# Patient Record
Sex: Female | Born: 1940 | Race: White | Hispanic: No | Marital: Married | Smoking: Never smoker
Health system: Southern US, Community
[De-identification: ages and names within clinical notes are randomized; demographics above are authoritative.]

## PROBLEM LIST (undated history)

## (undated) DIAGNOSIS — E039 Hypothyroidism, unspecified: Secondary | ICD-10-CM

## (undated) DIAGNOSIS — G20 Parkinson's disease: Secondary | ICD-10-CM

## (undated) DIAGNOSIS — G20A1 Parkinson's disease without dyskinesia, without mention of fluctuations: Secondary | ICD-10-CM

## (undated) DIAGNOSIS — I10 Essential (primary) hypertension: Secondary | ICD-10-CM

## (undated) DIAGNOSIS — E785 Hyperlipidemia, unspecified: Secondary | ICD-10-CM

## (undated) HISTORY — DX: Parkinson's disease: G20

## (undated) HISTORY — DX: Parkinson's disease without dyskinesia, without mention of fluctuations: G20.A1

## (undated) HISTORY — PX: ABDOMINAL HYSTERECTOMY: SHX81

## (undated) HISTORY — PX: APPENDECTOMY: SHX54

## (undated) HISTORY — DX: Essential (primary) hypertension: I10

## (undated) HISTORY — DX: Hypothyroidism, unspecified: E03.9

## (undated) HISTORY — DX: Hyperlipidemia, unspecified: E78.5

---

## 2013-04-19 DIAGNOSIS — M1990 Unspecified osteoarthritis, unspecified site: Secondary | ICD-10-CM | POA: Insufficient documentation

## 2013-04-19 DIAGNOSIS — R609 Edema, unspecified: Secondary | ICD-10-CM | POA: Insufficient documentation

## 2013-04-19 DIAGNOSIS — J309 Allergic rhinitis, unspecified: Secondary | ICD-10-CM | POA: Insufficient documentation

## 2013-04-19 DIAGNOSIS — G4700 Insomnia, unspecified: Secondary | ICD-10-CM | POA: Insufficient documentation

## 2013-05-12 DIAGNOSIS — R6884 Jaw pain: Secondary | ICD-10-CM | POA: Insufficient documentation

## 2013-06-30 DIAGNOSIS — I10 Essential (primary) hypertension: Secondary | ICD-10-CM | POA: Insufficient documentation

## 2013-10-18 DIAGNOSIS — M1990 Unspecified osteoarthritis, unspecified site: Secondary | ICD-10-CM | POA: Insufficient documentation

## 2013-12-29 DIAGNOSIS — M79606 Pain in leg, unspecified: Secondary | ICD-10-CM | POA: Insufficient documentation

## 2014-07-20 DIAGNOSIS — B372 Candidiasis of skin and nail: Secondary | ICD-10-CM | POA: Insufficient documentation

## 2014-07-20 DIAGNOSIS — R2681 Unsteadiness on feet: Secondary | ICD-10-CM | POA: Insufficient documentation

## 2014-12-18 DIAGNOSIS — R32 Unspecified urinary incontinence: Secondary | ICD-10-CM | POA: Insufficient documentation

## 2014-12-18 DIAGNOSIS — R531 Weakness: Secondary | ICD-10-CM | POA: Insufficient documentation

## 2015-05-31 ENCOUNTER — Encounter: Payer: Self-pay | Admitting: Neurology

## 2015-06-05 ENCOUNTER — Encounter: Payer: Self-pay | Admitting: Neurology

## 2015-06-05 ENCOUNTER — Ambulatory Visit (INDEPENDENT_AMBULATORY_CARE_PROVIDER_SITE_OTHER): Payer: Medicare Other | Admitting: Neurology

## 2015-06-05 VITALS — BP 150/88 | HR 88 | Ht 60.0 in

## 2015-06-05 DIAGNOSIS — R2681 Unsteadiness on feet: Secondary | ICD-10-CM

## 2015-06-05 DIAGNOSIS — R279 Unspecified lack of coordination: Secondary | ICD-10-CM | POA: Diagnosis not present

## 2015-06-05 DIAGNOSIS — R292 Abnormal reflex: Secondary | ICD-10-CM

## 2015-06-05 NOTE — Progress Notes (Signed)
Shelley Barnes was seen today in the movement disorders clinic for neurologic consultation.  Her PCP is [name], Nolon Bussing, MD.   The patient is seen today as a second opinion from Dr. Anne Hahn.  I have reviewed Dr. Clarisa Kindred records.  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  The patient was first seen by Dr. Anne Hahn in October, 2016 with a complaint of left-sided weakness.  Pt states that sx's started about one year ago.  She states that she noted difficulty getting in and out of a chair and difficulty bending over.  She agrees that she has L sided "weakness" and had trouble getting in and out of exam shorts today because she had trouble lifting the L leg.    The patient was started on levodopa by Dr. Anne Hahn, but was not able to tolerate more than carbidopa/levodopa 25/100, 1 tablet 4 times per day.   Pt states that her husband told her that the medication helped but the patient was not convinced.   Dosages higher than this caused nausea (tried 2 tablets at a time).  She admits that she has dropped the medication to one at night only for "jittery legs."   She refused DaT scanning per the records.    The patient had an MRI of the brain with and without gadolinium on 12/18/2014.  There was evidence of moderate atrophy and white matter disease.  I don't have films for my review.     Specific Symptoms:  Tremor: No. Voice: more raspy and weaker; trouble singing Sleep: sleeps well  Vivid Dreams:  No.  Acting out dreams:  No. Wet Pillows: No. Postural symptoms:  Yes.    Falls?  Yes.   (3 falls - one time getting out of bed; one time tying shoes and had one leg up to tie shoe and fell backward; one time someone at door and she got up too quickly and fell) Bradykinesia symptoms: difficulty getting out of a chair and drags the L leg and describes an antalgic gait Loss of smell:  No. Loss of taste:  Yes.   Urinary Incontinence:  Yes.   (has urinary urgency/frequency  but doesn't wear undergarments) Difficulty Swallowing:  No. Handwriting, micrographia: Yes.   (R handed) Trouble with ADL's:  Yes.   (trouble getting into pants; no longer able to wear panty hose)  Trouble buttoning clothing: No. Depression:  No. Memory changes:  No. Hallucinations:  No.  visual distortions: No. N/V:  No. Lightheaded:  No.  Syncope: No. Diplopia:  No. Dyskinesia:  No.    ALLERGIES:   Allergies  Allergen Reactions  . Azithromycin   . Clindamycin/Lincomycin     CURRENT MEDICATIONS:  Outpatient Encounter Prescriptions as of 06/05/2015  Medication Sig  . aspirin 81 MG tablet Take 81 mg by mouth daily.  . cetirizine (ZYRTEC) 10 MG tablet Take 10 mg by mouth daily.  . cholecalciferol (VITAMIN D) 1000 units tablet Take 5,000 Units by mouth daily.  . Cyanocobalamin (VITAMIN B-12) 2500 MCG SUBL Place under the tongue.  . furosemide (LASIX) 40 MG tablet Take 40 mg by mouth.  . levothyroxine (SYNTHROID, LEVOTHROID) 75 MCG tablet Take 75 mcg by mouth daily before breakfast.  . omeprazole (PRILOSEC) 20 MG capsule Take 20 mg by mouth daily.  . simvastatin (ZOCOR) 10 MG tablet Take 10 mg by mouth daily.  . [DISCONTINUED] carbidopa-levodopa (SINEMET IR) 25-100 MG tablet Take 2 tablets by mouth 3 (three) times daily.  . [  DISCONTINUED] rasagiline (AZILECT) 0.5 MG TABS tablet Take 0.5 mg by mouth daily.   No facility-administered encounter medications on file as of 06/05/2015.    PAST MEDICAL HISTORY:   Past Medical History  Diagnosis Date  . Hypothyroidism   . Hyperlipidemia   . Hypertension   . Parkinson's disease (HCC)     PAST SURGICAL HISTORY:   Past Surgical History  Procedure Laterality Date  . Appendectomy    . Abdominal hysterectomy      SOCIAL HISTORY:   Social History   Social History  . Marital Status: Married    Spouse Name: N/A  . Number of Children: N/A  . Years of Education: N/A   Occupational History  . retired     Engineer, site; Licensed conveyancer; Print production planner   Social History Main Topics  . Smoking status: Never Smoker   . Smokeless tobacco: Not on file  . Alcohol Use: No  . Drug Use: No  . Sexual Activity: Not on file   Other Topics Concern  . Not on file   Social History Narrative    FAMILY HISTORY:   Family Status  Relation Status Death Age  . Mother Deceased     HTN; unknown cause of death  . Father Deceased     Angina, DM  . Sister Alive     2, both with DM  . Brother Alive     DM  . Child Alive     2, adopted, healthy    ROS:  A complete 10 system review of systems was obtained and was unremarkable apart from what is mentioned above.  PHYSICAL EXAMINATION:    VITALS:   Filed Vitals:   06/05/15 1015  BP: 150/88  Pulse: 88  Height: 5' (1.524 m)   Pt refused to weigh  Pt placed into examining shorts and a gown for the examination.  GEN:  The patient appears stated age and is in NAD. HEENT:  Normocephalic, atraumatic.  The mucous membranes are moist. The superficial temporal arteries are without ropiness or tenderness.  No tongue fasciculations CV:  RRR Lungs:  CTAB Neck/HEME:  There are no carotid bruits bilaterally.  Neurological examination:  Orientation: The patient is alert and oriented x3. Fund of knowledge is appropriate.  Recent and remote memory are intact.  Attention and concentration are normal.    Able to name objects and repeat phrases. Cranial nerves: There is good facial symmetry. No facial hypomimia.  Pupils are equal round and reactive to light bilaterally. Fundoscopic exam reveals clear margins bilaterally. Extraocular muscles are intact with the exception of mild upgaze paresis.  There were a few square wave jerks.   The visual fields are full to confrontational testing. The speech is fluent and clear. Minimal trouble with gutteral sounds.  Soft palate rises symmetrically and there is no tongue deviation. Hearing is intact to conversational tone. Sensation: Sensation is  intact to light and pinprick throughout (facial, trunk, extremities). Vibration is absent at the bilateral big toe, ankle and decreased at the knee. There is no extinction with double simultaneous stimulation. There is no sensory dermatomal level identified. Motor: Strength is 5/5 in the bilateral upper and lower extremities.   Shoulder shrug is equal and symmetric.  There is no pronator drift.  No fasciculations noted across the chest, back, arms or legs. Deep tendon reflexes: Deep tendon reflexes are 2+-3/4 at the bilateral biceps, triceps, brachioradialis, patella and achilles. Plantar response is upgoing on the L and downgoing  on the R  Movement examination: Tone: There is normal tone in the bilateral upper extremities.  The tone in the lower extremities is normal.  Tone remains normal even with distraction procedures. Abnormal movements: none, even with distraction Coordination:  Pt does have slowness with finger taps and alternation of supination/pronation of forearm on the L but no decremation. Gait and Station: The patient has minimal difficulty arising out of a deep-seated chair without the use of the hands.  She ambulates with a wide-based gait.  She has difficulty ambulating in a tandem fashion.  She is able to stand in the Romberg position.  ASSESSMENT/PLAN:  1.  Gait instability with L sided incoordination  -Long talk with the patient today.  I told her that she does not have idiopathic Parkinson's disease.  While she may have MSA, it is not at the top of my list of differential diagnoses (and she is a little old for this dx).  She really has very normal tone and her walking is not parkinsonian.  She does have an upgoing toe on the left.  I talked to her about doing an MRI of the cervical spine.  We also talked about potentially looking at EMG.  If not early done so, I would recommend anti-GAD antibodies for completeness sake.  She is going to talk this over with Dr. Anne Hahn.  She has not  wanted to pursue DaT.  I did tell her that if her w/u is negative and the diagnosis continues to be elusive, I would be happy to see her back and re-evaluate her.  She understood and said that she would make an appt today to discuss the above with Dr. Anne Hahn.

## 2015-07-23 DIAGNOSIS — M5030 Other cervical disc degeneration, unspecified cervical region: Secondary | ICD-10-CM | POA: Insufficient documentation

## 2015-07-23 DIAGNOSIS — M4802 Spinal stenosis, cervical region: Secondary | ICD-10-CM | POA: Insufficient documentation

## 2015-07-25 ENCOUNTER — Ambulatory Visit: Payer: Medicare Other | Admitting: Neurology

## 2015-07-26 ENCOUNTER — Ambulatory Visit (INDEPENDENT_AMBULATORY_CARE_PROVIDER_SITE_OTHER): Payer: Medicare Other | Admitting: Neurology

## 2015-07-26 ENCOUNTER — Telehealth: Payer: Self-pay | Admitting: Neurology

## 2015-07-26 ENCOUNTER — Encounter: Payer: Self-pay | Admitting: Neurology

## 2015-07-26 VITALS — BP 148/88 | HR 85 | Ht 60.0 in

## 2015-07-26 DIAGNOSIS — R251 Tremor, unspecified: Secondary | ICD-10-CM

## 2015-07-26 DIAGNOSIS — G20 Parkinson's disease: Secondary | ICD-10-CM

## 2015-07-26 NOTE — Patient Instructions (Signed)
1. We are referring you to [facility] for a DaT scan. They will contact you directly to set up a time for this testing. If you do not hear from them they can be contacted at [phone].  

## 2015-07-26 NOTE — Progress Notes (Signed)
Shelley Barnes was seen today in the movement disorders clinic for neurologic consultation.  Her PCP is Shelley Barnes, Shelley Bussing, MD.   The patient is seen today as a second opinion from Dr. Anne Hahn.  I have reviewed Dr. Clarisa Kindred records.  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  The patient was first seen by Dr. Anne Hahn in October, 2016 with a complaint of left-sided weakness.  Pt states that sx's started about one year ago.  She states that she noted difficulty getting in and out of a chair and difficulty bending over.  She agrees that she has L sided "weakness" and had trouble getting in and out of exam shorts today because she had trouble lifting the L leg.    The patient was started on levodopa by Dr. Anne Hahn, but was not able to tolerate more than carbidopa/levodopa 25/100, 1 tablet 4 times per day.   Pt states that her husband told her that the medication helped but the patient was not convinced.   Dosages higher than this caused nausea (tried 2 tablets at a time).  She admits that she has dropped the medication to one at night only for "jittery legs."   She refused DaT scanning per the records.    The patient had an MRI of the brain with and without gadolinium on 12/18/2014.  There was evidence of moderate atrophy and white matter disease.  I don't have films for my review.     07/26/15 update:  The patient follows up today.  This patient is accompanied in the office by her spouse who supplements the history.  She went back to Dr. Anne Hahn since our last visit and had an EMG.  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  This was reported to show mildly reduced activation in all leg muscles, consistent with either poor effort or upper motor neuron lesion.  She has previously had an MRI of the brain in August, 2016 that apparently showed atrophy and moderate white matter disease.  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  There was a disc protrusion at the C6-C7 level but no cord deformity.  I do not have those films, but only the report.  She subsequently saw neurosurgery on 07/23/2015.  I reviewed those notes, although I am not sure if they are complete.  There was no plan listed, only a diagnosis.  They tell me today that the neurosurgeon told them that they were told that there was no myelopathy as the cause of the sx's.  They express that neurosurgeon was concerned that the "tongue was vibrating."  Husband and patient states that she is the same since last visit.  No falls.  No swallowing.      ALLERGIES:   Allergies  Allergen Reactions  . Azithromycin   . Clindamycin/Lincomycin     CURRENT MEDICATIONS:  Outpatient Encounter Prescriptions as of 07/26/2015  Medication Sig  . aspirin 81 MG tablet Take 81 mg by mouth daily.  . cetirizine (ZYRTEC) 10 MG tablet Take 10 mg by mouth daily.  . cholecalciferol (VITAMIN D) 1000 units tablet Take 5,000 Units by mouth daily.  . Cyanocobalamin (VITAMIN B-12) 2500 MCG SUBL Place under the tongue.  . furosemide (LASIX) 40 MG tablet Take  40 mg by mouth.  . levothyroxine (SYNTHROID, LEVOTHROID) 75 MCG tablet Take 75 mcg by mouth daily before breakfast.  . omeprazole (PRILOSEC) 20 MG capsule Take 20 mg by mouth daily.  . simvastatin (ZOCOR) 10 MG tablet Take 10 mg by mouth daily.   No facility-administered encounter medications on file as of 07/26/2015.    PAST MEDICAL HISTORY:   Past Medical History  Diagnosis Date  . Hypothyroidism   . Hyperlipidemia   . Hypertension   . Parkinson's disease (HCC)     PAST SURGICAL HISTORY:   Past Surgical History  Procedure Laterality Date  . Appendectomy    . Abdominal hysterectomy      SOCIAL HISTORY:   Social History   Social History    . Marital Status: Married    Spouse Name: N/A  . Number of Children: N/A  . Years of Education: N/A   Occupational History  . retired     Engineer, site; Research officer, political party; Print production planner   Social History Main Topics  . Smoking status: Never Smoker   . Smokeless tobacco: Not on file  . Alcohol Use: No  . Drug Use: No  . Sexual Activity: Not on file   Other Topics Concern  . Not on file   Social History Narrative    FAMILY HISTORY:   Family Status  Relation Status Death Age  . Mother Deceased     HTN; unknown cause of death  . Father Deceased     Angina, DM  . Sister Alive     2, both with DM  . Brother Alive     DM  . Child Alive     2, adopted, healthy    ROS:  A complete 10 system review of systems was obtained and was unremarkable apart from what is mentioned above.  PHYSICAL EXAMINATION:    VITALS:   Filed Vitals:   07/26/15 1013  BP: 148/88  Pulse: 85  Height: 5' (1.524 m)   Pt refused to weigh  Pt placed into examining shorts and a gown for the examination.  GEN:  The patient appears stated age and is in NAD. HEENT:  Normocephalic, atraumatic.  The mucous membranes are moist. The superficial temporal arteries are without ropiness or tenderness.  No tongue fasciculations CV:  RRR Lungs:  CTAB Neck/HEME:  There are no carotid bruits bilaterally.  Neurological examination:  Orientation: The patient is alert and oriented x3.  Cranial nerves: There is good facial symmetry. No facial hypomimia.  Pupils are equal round and reactive to light bilaterally. Fundoscopic exam reveals clear margins bilaterally. Extraocular muscles are intact with the exception of mild upgaze paresis.  She is also somewhat apraxic when asked to perform EOM testing.  There were a few square wave jerks.   The visual fields are full to confrontational testing. The speech is fluent and clear. Minimal trouble with gutteral sounds.  Soft palate rises symmetrically and there is no tongue  deviation. Hearing is intact to conversational tone. Sensation: Sensation is intact to light and pinprick throughout (facial, trunk, extremities). Vibration is absent at the bilateral big toe, ankle and decreased at the knee. There is no extinction with double simultaneous stimulation. There is no sensory dermatomal level identified. Motor: Strength is 5/5 in the bilateral upper and lower extremities.   Shoulder shrug is equal and symmetric.  There is no pronator drift.  No fasciculations noted across the chest, back, arms or legs. Deep tendon reflexes: Deep tendon reflexes are  2+/4 at the bilateral biceps, triceps, brachioradialis, patella and achilles. Plantar response is upgoing bilaterally today  Movement examination: Tone: There is normal tone in the bilateral upper extremities.  The tone in the lower extremities is normal.  Tone remains normal even with distraction procedures. Abnormal movements: slight action tremor when asked to mimic motion of screwing in light bulb.  Otherwise none. Coordination:  Pt does have slowness of all forms of RAMs on the L.   Gait and Station: The patient has minimal difficulty arising out of a deep-seated chair without the use of the hands.  She ambulates with a wide-based gait.  She has difficulty ambulating in a tandem fashion.  She is able to stand in the Romberg position.  ASSESSMENT/PLAN:  1.  Gait instability with L sided incoordination  -Long talk with the patient and her husband today.  I once again told him that she does not have idiopathic Parkinson's disease.  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  I'm going to send her for a DaT scan.  She has already tried levodopa in the past, although at fairly low dosages and we may need to push the dosage in the future.  If this workup is negative, then I may have her see Dr. Allena KatzPatel.  She and her husband were in agreement.  Follow-up after her DaT  scan.  Greater than 50% of the 30 minute visit was spent in counseling with the patient and her husband.  He had not come to previous visit and had several questions.

## 2015-07-26 NOTE — Telephone Encounter (Signed)
[facility]. They will call patient to schedule.

## 2015-07-27 ENCOUNTER — Encounter: Payer: Self-pay | Admitting: Neurology

## 2015-08-09 ENCOUNTER — Encounter: Payer: Self-pay | Admitting: Neurology

## 2015-08-22 ENCOUNTER — Encounter: Payer: Self-pay | Admitting: Neurology

## 2015-08-22 ENCOUNTER — Ambulatory Visit (INDEPENDENT_AMBULATORY_CARE_PROVIDER_SITE_OTHER): Payer: Medicare Other | Admitting: Neurology

## 2015-08-22 VITALS — BP 126/78 | HR 84 | Resp 16

## 2015-08-22 DIAGNOSIS — R1314 Dysphagia, pharyngoesophageal phase: Secondary | ICD-10-CM | POA: Diagnosis not present

## 2015-08-22 DIAGNOSIS — G231 Progressive supranuclear ophthalmoplegia [Steele-Richardson-Olszewski]: Secondary | ICD-10-CM | POA: Diagnosis not present

## 2015-08-22 DIAGNOSIS — G20 Parkinson's disease: Secondary | ICD-10-CM | POA: Diagnosis not present

## 2015-08-22 MED ORDER — CARBIDOPA-LEVODOPA 25-100 MG PO TABS: 1.0000 | ORAL_TABLET | Freq: Three times a day (TID) | ORAL | Status: DC

## 2015-08-22 NOTE — Progress Notes (Signed)
Shelley Barnes was seen today in the movement disorders clinic for neurologic consultation.  Her PCP is [name], Nolon Bussing, MD.   The patient is seen today as a second opinion from Dr. Anne Hahn.  I have reviewed Dr. Clarisa Kindred records.  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  The patient was first seen by Dr. Anne Hahn in October, 2016 with a complaint of left-sided weakness.  Pt states that sx's started about one year ago.  She states that she noted difficulty getting in and out of a chair and difficulty bending over.  She agrees that she has L sided "weakness" and had trouble getting in and out of exam shorts today because she had trouble lifting the L leg.    The patient was started on levodopa by Dr. Anne Hahn, but was not able to tolerate more than carbidopa/levodopa 25/100, 1 tablet 4 times per day.   Pt states that her husband told her that the medication helped but the patient was not convinced.   Dosages higher than this caused nausea (tried 2 tablets at a time).  She admits that she has dropped the medication to one at night only for "jittery legs."   She refused DaT scanning per the records.    The patient had an MRI of the brain with and without gadolinium on 12/18/2014.  There was evidence of moderate atrophy and white matter disease.  I don't have films for my review.     07/26/15 update:  The patient follows up today.  This patient is accompanied in the office by her spouse who supplements the history.  She went back to Dr. Anne Hahn since our last visit and had an EMG.  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  This was reported to show mildly reduced activation in all leg muscles, consistent with either poor effort or upper motor neuron lesion.  She has previously had an MRI of the brain in August, 2016 that apparently showed atrophy and moderate white matter disease.  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  There was a disc protrusion at the C6-C7 level but no cord deformity.  I do not have those films, but only the report.  She subsequently saw neurosurgery on 07/23/2015.  I reviewed those notes, although I am not sure if they are complete.  There was no plan listed, only a diagnosis.  They tell me today that the neurosurgeon told them that they were told that there was no myelopathy as the cause of the sx's.  They express that neurosurgeon was concerned that the "tongue was vibrating."  Husband and patient states that she is the same since last visit.  No falls.  No swallowing.    08/22/15 update:  The patient follows up today, accompanied by her husband who supplements the history.  Since our last visit, she did have a DaT scan.  The study was limited by motion, but it was felt to be interpretable.  There was asymmetric uptake, grade 1, with normal uptake on the left and decreased uptake in the right putamen.  This does correlate with her symptoms of decreased coordination on the left.    ALLERGIES:   Allergies  Allergen Reactions  . Azithromycin   . Clindamycin/Lincomycin  CURRENT MEDICATIONS:  Outpatient Encounter Prescriptions as of 08/22/2015  Medication Sig  . aspirin 81 MG tablet Take 81 mg by mouth daily.  . cetirizine (ZYRTEC) 10 MG tablet Take 10 mg by mouth daily.  . cholecalciferol (VITAMIN D) 1000 units tablet Take 5,000 Units by mouth daily.  . Cyanocobalamin (VITAMIN B-12) 2500 MCG SUBL Place under the tongue.  . furosemide (LASIX) 40 MG tablet Take 40 mg by mouth.  . levothyroxine (SYNTHROID, LEVOTHROID) 75 MCG tablet Take 75 mcg by mouth daily before breakfast.  . omeprazole (PRILOSEC) 20 MG capsule Take 20 mg by mouth daily.  . simvastatin (ZOCOR) 10 MG tablet Take 10 mg by mouth daily.  .  carbidopa-levodopa (SINEMET IR) 25-100 MG tablet Take 1 tablet by mouth 3 (three) times daily.   No facility-administered encounter medications on file as of 08/22/2015.    PAST MEDICAL HISTORY:   Past Medical History  Diagnosis Date  . Hypothyroidism   . Hyperlipidemia   . Hypertension   . Parkinson's disease (HCC)     PAST SURGICAL HISTORY:   Past Surgical History  Procedure Laterality Date  . Appendectomy    . Abdominal hysterectomy      SOCIAL HISTORY:   Social History   Social History  . Marital Status: Married    Spouse Name: N/A  . Number of Children: N/A  . Years of Education: N/A   Occupational History  . 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 Smoking status: Never Smoker   . Smokeless tobacco: Not on file  . Alcohol Use: No  . Drug Use: No  . Sexual Activity: Not on file   Other Topics Concern  . Not on file   Social History Narrative    FAMILY HISTORY:   Family Status  Relation Status Death Age  . Mother Deceased     HTN; unknown cause of death  . Father Deceased     Angina, DM  . Sister Alive     2, both with DM  . Brother Alive     DM  . Child Alive     2, adopted, healthy    ROS:  A complete 10 system review of systems was obtained and was unremarkable apart from what is mentioned above.  PHYSICAL EXAMINATION:    VITALS:   Filed Vitals:   08/22/15 1024  BP: 126/78  Pulse: 84  Resp: 16  SpO2: 96%   Pt refused to weigh  Pt placed into examining shorts and a gown for the examination.  GEN:  The patient appears stated age and is in NAD. HEENT:  Normocephalic, atraumatic.  The mucous membranes are moist. The superficial temporal arteries are without ropiness or tenderness.  No tongue fasciculations CV:  RRR Lungs:  CTAB Neck/HEME:  There are no carotid bruits bilaterally.  Neurological examination:  Orientation: The patient is alert and oriented x3.  Cranial nerves: There is  facial hypomimia.    Extraocular muscles are intact with the exception of mild upgaze paresis.  She is also somewhat apraxic when asked to perform EOM testing.  There were a few square wave jerks.   The visual fields are full to confrontational testing. The speech is fluent and clear. Minimal trouble with gutteral sounds.  She is mildly hypophonic.  Soft palate rises symmetrically and there is no tongue deviation. Hearing is intact to conversational tone. Sensation: Sensation is intact to light and pinprick  throughout (facial, trunk, extremities). Vibration is absent at the bilateral big toe, ankle and decreased at the knee. There is no extinction with double simultaneous stimulation. There is no sensory dermatomal level identified. Motor: Strength is 5/5 in the bilateral upper and lower extremities.   Shoulder shrug is equal and symmetric.  There is no pronator drift.  No fasciculations noted across the chest, back, arms or legs. Deep tendon reflexes: Deep tendon reflexes are 2+/4 at the bilateral biceps, triceps, brachioradialis, patella and achilles. Plantar response is upgoing bilaterally today  Movement examination: Tone: There is very mild increased tone in the left upper extremity and perhaps left lower extremity as well. Abnormal movements: slight action tremor when asked to mimic motion of screwing in light bulb.  Otherwise none. Coordination:  Pt does have slowness of all forms of RAMs on the L.   Gait and Station: The patient has minimal difficulty arising out of a deep-seated chair without the use of the hands.  She is more unsteady than previously.  She drags the left leg with ambulation.  She is more short stepped.  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  This was done on 08/08/2015.  There was asymmetric uptake with normal uptake on the left and decreased uptake in the right putamen.  This  correlates well with symptoms of decreased coordination on the left.  She has tried levodopa in the past at one tablet 4 times per day.  She then immediately jumped to 2 tablets 4 times a day and had nausea, which is not surprising given the quick jump.  The lower dose likely was not enough to notice a difference, or she may not be levodopa responsive with one of the atypical states.  I think I would like to restart her on medication and then push the dose a little slower.  We talked about risks, benefits, and side effects.  She was agreeable.  I am just going to start her slowly and work up to one tablet 3 times a day, which we know is not going to be enough but also should not bake her sick.  -I invited her to the atypical parkinsonism symposium  -Patient education was provided.  I gave her many resources to the cure P[facility].  -We talked about prognosis.  -She was given a referral for physical and occupational therapy, which she will do at [facility]  -She reports that she just had her eyes checked with Dr. Dione Booze.  -She had a modified barium swallow done on 12/26/2014 and that showed mild pharyngeal dysphagia and had one episode of flash penetration with thin liquids when consumed via a straw.  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 2.  Follow up is anticipated in the next few months, sooner should new neurologic issues arise.  Much greater than 50% of this visit was spent in counseling and coordinating care.  Total face to face time:  45 min

## 2015-08-22 NOTE — Patient Instructions (Signed)
1. [facility]  2. Start Carbidopa Levodopa as follows:  Take 1/2 tablet three times daily, at least 30 minutes before meals, for one week  Then take 1/2 tablet in the morning, 1/2 tablet in the afternoon, 1 tablet in the evening, at least 30 minutes before meals, for one week  Then take 1/2 tablet in the morning, 1 tablet in the afternoon, 1 tablet in the evening, at least 30 minutes before meals, for one week Then take 1 tablet three times daily, at least 30 minutes before meals 3. Follow up 6-8 weeks

## 2015-08-24 DIAGNOSIS — G20 Parkinson's disease: Secondary | ICD-10-CM | POA: Insufficient documentation

## 2015-09-20 DIAGNOSIS — E559 Vitamin D deficiency, unspecified: Secondary | ICD-10-CM | POA: Insufficient documentation

## 2015-09-20 DIAGNOSIS — R1313 Dysphagia, pharyngeal phase: Secondary | ICD-10-CM | POA: Insufficient documentation

## 2015-09-20 DIAGNOSIS — E039 Hypothyroidism, unspecified: Secondary | ICD-10-CM | POA: Insufficient documentation

## 2015-09-20 DIAGNOSIS — E785 Hyperlipidemia, unspecified: Secondary | ICD-10-CM | POA: Insufficient documentation

## 2015-09-20 DIAGNOSIS — G231 Progressive supranuclear ophthalmoplegia [Steele-Richardson-Olszewski]: Secondary | ICD-10-CM | POA: Insufficient documentation

## 2015-09-20 DIAGNOSIS — E119 Type 2 diabetes mellitus without complications: Secondary | ICD-10-CM | POA: Insufficient documentation

## 2015-10-15 DIAGNOSIS — M25552 Pain in left hip: Secondary | ICD-10-CM | POA: Insufficient documentation

## 2015-10-22 ENCOUNTER — Ambulatory Visit: Payer: Medicare Other | Admitting: Neurology

## 2015-11-02 ENCOUNTER — Ambulatory Visit (INDEPENDENT_AMBULATORY_CARE_PROVIDER_SITE_OTHER): Payer: Medicare Other | Admitting: Neurology

## 2015-11-02 ENCOUNTER — Encounter: Payer: Self-pay | Admitting: Neurology

## 2015-11-02 ENCOUNTER — Other Ambulatory Visit (HOSPITAL_COMMUNITY): Payer: Self-pay | Admitting: Neurology

## 2015-11-02 VITALS — BP 114/70 | HR 83 | Ht 61.0 in

## 2015-11-02 DIAGNOSIS — R1310 Dysphagia, unspecified: Secondary | ICD-10-CM | POA: Diagnosis not present

## 2015-11-02 DIAGNOSIS — G231 Progressive supranuclear ophthalmoplegia [Steele-Richardson-Olszewski]: Secondary | ICD-10-CM

## 2015-11-02 NOTE — Patient Instructions (Signed)
Increase carbidopa/levodopa 25/100 as follows:   Week 1:  Take 2 tablets in the AM, 1 at noon and 1 in the evening Week 2:  Take 2 in the AM, 1 in at noon and 2 in the evening Week 3 and thereafter take 2 tablets three times a day  Can slow this down if have any symptoms of lightheadedness or nausea.  Take med with a carbohydrate, but not protein, if nauseated

## 2015-11-02 NOTE — Progress Notes (Signed)
Shelley Barnes was seen today in the movement disorders clinic for neurologic consultation.  Her PCP is [name], [name], MD.   The patient is seen today as a second opinion from [name].  I have reviewed Dr. [name]'s records.  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  The patient was first seen by [name] in October, 2016 with a complaint of left-sided weakness.  Pt states that sx's started about one year ago.  She states that she noted difficulty getting in and out of a chair and difficulty bending over.  She agrees that she has L sided "weakness" and had trouble getting in and out of exam shorts today because she had trouble lifting the L leg.    The patient was started on levodopa by [name], but was not able to tolerate more than carbidopa/levodopa 25/100, 1 tablet 4 times per day.   Pt states that her husband told her that the medication helped but the patient was not convinced.   Dosages higher than this caused nausea (tried 2 tablets at a time).  She admits that she has dropped the medication to one at night only for "jittery legs."   She refused DaT scanning per the records.    The patient had an MRI of the brain with and without gadolinium on 12/18/2014.  There was evidence of moderate atrophy and white matter disease.  I don't have films for my review.     07/26/15 update:  The patient follows up today.  This patient is accompanied in the office by her spouse who supplements the history.  She went back to [name] since our last visit and had an EMG.  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  This was reported to show mildly reduced activation in all leg muscles, consistent with either poor effort or upper motor neuron lesion.  She has previously had an MRI of the brain in August, 2016 that apparently showed atrophy and moderate white matter disease.  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  There was a disc protrusion at the C6-C7 level but no cord deformity.  I do not have those films, but only the report.  She subsequently saw neurosurgery on 07/23/2015.  I reviewed those notes, although I am not sure if they are complete.  There was no plan listed, only a diagnosis.  They tell me today that the neurosurgeon told them that they were told that there was no myelopathy as the cause of the sx's.  They express that neurosurgeon was concerned that the "tongue was vibrating."  Husband and patient states that she is the same since last visit.  No falls.  No swallowing.    08/22/15 update:  The patient follows up today, accompanied by her husband who supplements the history.  Since our last visit, she did have a DaT scan.  The study was limited by motion, but it was felt to be interpretable.  There was asymmetric uptake, grade 1, with normal uptake on the left and decreased uptake in the right putamen.  This does correlate with her symptoms of decreased coordination on the left.  11/02/15 update:  The patient follows up today, accompanied by her husband who supplements the history.  Since our last visit, she got started on low dose levodopa and has worked to 1 po tid.  She had SE in past when at 2 po qid but she didn't titrate upward very slow.  She did PT/OT since our last visit.  She has completed OT but is still doing PT.  OT helped with strength.  PT she hasn't found helpful.   Had one fall getting out of beauty shop chair and tripped over the bar that raises the chair and fell back.  [place].  Did not get hurt with either of these.  Didn't have walker with either.    No hallucinations.  No syncope.  Went to symposium in [place] and learned a lot.  Husband states coughing a lot with eating.  Also hip  pain.  Xray neg.      ALLERGIES:   Allergies  Allergen Reactions  . Azithromycin   . Clindamycin/Lincomycin     CURRENT MEDICATIONS:  Outpatient Encounter Prescriptions as of 11/02/2015  Medication Sig  . aspirin 81 MG tablet Take 81 mg by mouth daily.  . carbidopa-levodopa (SINEMET IR) 25-100 MG tablet Take 1 tablet by mouth 3 (three) times daily.  . cetirizine (ZYRTEC) 10 MG tablet Take 10 mg by mouth daily.  . cholecalciferol (VITAMIN D) 1000 units tablet Take 5,000 Units by mouth daily.  . Cyanocobalamin (VITAMIN B-12) 2500 MCG SUBL Place under the tongue.  . furosemide (LASIX) 40 MG tablet Take 40 mg by mouth.  . levothyroxine (SYNTHROID, LEVOTHROID) 75 MCG tablet Take 75 mcg by mouth daily before breakfast.  . omeprazole (PRILOSEC) 20 MG capsule Take 20 mg by mouth daily.  . simvastatin (ZOCOR) 10 MG tablet Take 10 mg by mouth daily.   No facility-administered encounter medications on file as of 11/02/2015.    PAST MEDICAL HISTORY:   Past Medical History  Diagnosis Date  . Hypothyroidism   . Hyperlipidemia   . Hypertension   . Parkinson's disease (HCC)     PAST SURGICAL HISTORY:   Past Surgical History  Procedure Laterality Date  . Appendectomy    . Abdominal hysterectomy      SOCIAL HISTORY:   Social History   Social History  . Marital Status: Married    Spouse Name: N/A  . Number of Children: N/A  . Years of Education: N/A   Occupational History  . 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 Smoking status: Never Smoker   . Smokeless tobacco: Not on file  . Alcohol Use: No  . Drug Use: No  . Sexual Activity: Not on file   Other Topics Concern  . Not on file   Social History Narrative    FAMILY HISTORY:   Family Status  Relation Status Death Age  . Mother Deceased     HTN; unknown cause of death  . Father Deceased     Angina, DM  . Sister Alive     2, both with DM  . Brother Alive     DM  .  Child Alive     2, adopted, healthy    ROS:  A complete 10 system review of systems was obtained and was unremarkable apart from what is mentioned above.  PHYSICAL EXAMINATION:    VITALS:   Filed Vitals:   11/02/15 1045  BP: 114/70  Pulse: 83  Height: 5\' 1"  (1.549 m)   Pt refused to weigh  GEN:  The patient appears stated age and is in NAD. HEENT:  Normocephalic, atraumatic.  The mucous membranes are moist. The superficial temporal arteries are without ropiness or tenderness.  No tongue fasciculations CV:  RRR Lungs:  CTAB Neck/HEME:  There are no carotid bruits bilaterally.  Neurological examination:  Orientation: The patient is alert and oriented x3.  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    Extraocular muscles are intact with the exception of mild upgaze paresis.  She is also somewhat apraxic when asked to perform EOM testing.  There were a few square wave jerks.   The visual fields are full to confrontational testing. The speech is fluent and clear. Minimal trouble with gutteral sounds.  She is mildly hypophonic.  Soft palate rises symmetrically and there is no tongue deviation. Hearing is intact to conversational tone. Sensation: Sensation is intact to light touch throughout Motor: Strength is 5/5 in the bilateral upper and lower extremities.   Shoulder shrug is equal and symmetric.  There is no pronator drift.  No fasciculations noted across the chest, back, arms or legs. Deep tendon reflexes: Deep tendon reflexes are 2+/4 at the bilateral biceps, triceps, brachioradialis, patella and achilles. Plantar response is upgoing bilaterally today  Movement examination: Tone: There is good tone today Abnormal movements: slight action tremor when asked to mimic motion of screwing in light bulb.  Otherwise none. Coordination:  Pt does have slowness of all forms of RAMs on the L but is improved compared to last visit Gait and Station: The patient has minimal difficulty arising out  of a deep-seated chair without the use of the hands.  She is unsteady without the walker but with it, she does well.  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  Talked about LOC orders.  Asked her to bring me her living will 2.  MBE  -She had a modified barium swallow done on 12/26/2014 and that showed mild pharyngeal dysphagia and had one episode of flash penetration with thin liquids when consumed via a straw.  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  Husband states that it is worse and she is coughing and will reorder 3.  L hip pain  -recommend ortho eval 4.  Follow up is anticipated in the next few months, sooner should new neurologic issues arise.  Much greater than 50% of this visit was spent in counseling and coordinating care.  Total face to face time:  25 min

## 2015-11-05 ENCOUNTER — Encounter: Payer: Self-pay | Admitting: Neurology

## 2015-11-05 MED ORDER — CARBIDOPA-LEVODOPA 25-100 MG PO TABS: 2.0000 | ORAL_TABLET | Freq: Three times a day (TID) | ORAL | Status: DC

## 2015-11-13 ENCOUNTER — Encounter: Payer: Self-pay | Admitting: Neurology

## 2015-11-14 ENCOUNTER — Ambulatory Visit (HOSPITAL_COMMUNITY)
Admission: RE | Admit: 2015-11-14 | Discharge: 2015-11-14 | Disposition: A | Discharge status: Home / Self Care | Payer: Medicare Other | Source: Ambulatory Visit | Attending: Neurology | Admitting: Neurology

## 2015-11-14 DIAGNOSIS — G20 Parkinson's disease: Secondary | ICD-10-CM | POA: Diagnosis not present

## 2015-11-14 DIAGNOSIS — I10 Essential (primary) hypertension: Secondary | ICD-10-CM | POA: Insufficient documentation

## 2015-11-14 DIAGNOSIS — R1310 Dysphagia, unspecified: Secondary | ICD-10-CM | POA: Diagnosis present

## 2015-11-14 DIAGNOSIS — E039 Hypothyroidism, unspecified: Secondary | ICD-10-CM | POA: Diagnosis not present

## 2015-11-14 DIAGNOSIS — E785 Hyperlipidemia, unspecified: Secondary | ICD-10-CM | POA: Diagnosis not present

## 2015-11-14 DIAGNOSIS — R1313 Dysphagia, pharyngeal phase: Secondary | ICD-10-CM | POA: Diagnosis not present

## 2015-11-14 NOTE — Progress Notes (Signed)
MBSS complete. Full report located under chart review in imaging section.   CHL IP CLINICAL IMPRESSIONS 11/14/2015  Therapy Diagnosis Mild pharyngeal phase dysphagia  Clinical Impression Pt exhibited a mild pharyngeal phase dysphagia due to mild sensory impairments resulting in delayed swallow intiation to her valleculae. No pharyngeal residue, adequate laryngeal elevation and pharyngeal peristalsis. Difficulty propelling barium pill with thin barium and required several attempts with applesauce. Per description of husband and note in chart, she is likely have episodic aspriation events. Educated pt and spouse to be diligent with swallow precautions as pt's PSP progresses (avoid multiconsistency textures, straws allowed if small sips, meat moist and tender, no speaking immediately after swallowing, sit upright). Recommend regular textures and thin liquids. Educated pt/spouse to pills whole with applesauce if becomes difficult with thin.      Impact on safety and function Moderate aspiration risk  Royce MacadamiaLisa Willis Albirda Shiel M.Ed [employer] Pager [phone]

## 2015-11-23 ENCOUNTER — Telehealth: Payer: Self-pay | Admitting: Neurology

## 2015-11-23 NOTE — Telephone Encounter (Signed)
Called patient to see how she was doing on Levodopa 1.5 TID. She states she is having trouble with splitting pills, but no side effects.   I advised her Dr. Arbutus Leas for her to increase to 1 tablet 6 times daily. She will try this and let me know how she is doing.

## 2016-01-02 DIAGNOSIS — M1612 Unilateral primary osteoarthritis, left hip: Secondary | ICD-10-CM | POA: Insufficient documentation

## 2016-01-07 NOTE — Progress Notes (Signed)
Shelley Barnes was seen today in the movement disorders clinic for neurologic consultation.  Her PCP is [name], [name], MD.   The patient is seen today as a second opinion from Dr. Anne Barnes.  I have reviewed Dr. Clarisa Barnes's records.  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  The patient was first seen by Dr. Anne Barnes in October, 2016 with a complaint of left-sided weakness.  Pt states that sx's started about one year ago.  She states that she noted difficulty getting in and out of a chair and difficulty bending over.  She agrees that she has L sided "weakness" and had trouble getting in and out of exam shorts today because she had trouble lifting the L leg.    The patient was started on levodopa by Dr. Anne Barnes, but was not able to tolerate more than carbidopa/levodopa 25/100, 1 tablet 4 times per day.   Pt states that her husband told her that the medication helped but the patient was not convinced.   Dosages higher than this caused nausea (tried 2 tablets at a time).  She admits that she has dropped the medication to one at night only for "jittery legs."   She refused DaT scanning per the records.    The patient had an MRI of the brain with and without gadolinium on 12/18/2014.  There was evidence of moderate atrophy and white matter disease.  I don't have films for my review.     07/26/15 update:  The patient follows up today.  This patient is accompanied in the office by her spouse who supplements the history.  She went back to Dr. Anne Barnes since our last visit and had an EMG.  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  This was reported to show mildly reduced activation in all leg muscles, consistent with either poor effort or upper motor neuron lesion.  She has previously had an MRI of the brain in August, 2016 that apparently showed atrophy and moderate white matter disease.  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  There was a disc protrusion at the C6-C7 level but no cord deformity.  I do not have those films, but only the report.  She subsequently saw neurosurgery on 07/23/2015.  I reviewed those notes, although I am not sure if they are complete.  There was no plan listed, only a diagnosis.  They tell me today that the neurosurgeon told them that they were told that there was no myelopathy as the cause of the sx's.  They express that neurosurgeon was concerned that the "tongue was vibrating."  Husband and patient states that she is the same since last visit.  No falls.  No swallowing.    08/22/15 update:  The patient follows up today, accompanied by her husband who supplements the history.  Since our last visit, she did have a DaT scan.  The study was limited by motion, but it was felt to be interpretable.  There was asymmetric uptake, grade 1, with normal uptake on the left and decreased uptake in the right putamen.  This does correlate with her symptoms of decreased coordination on the left.  11/02/15 update:  The patient follows up today, accompanied by her husband who supplements the history.  Since our last visit, she got started on low dose levodopa and has worked to 1 po tid.  She had SE in past when at 2 po qid but she didn't titrate upward very slow.  She did PT/OT since our last visit.  She has completed OT but is still doing PT.  OT helped with strength.  PT she hasn't found helpful.   Had one fall getting out of beauty shop chair and tripped over the bar that raises the chair and fell back.  Slipped on tile in [place].  Did not get hurt with either of these.  Didn't have walker with either.    No hallucinations.  No syncope.  Went to symposium in [place] and learned a lot.  Husband states coughing a lot with eating.  Also hip  pain.  Xray neg.    01/08/16 update:  The patient follows up today, accompanied by her husband who supplements the history.  Her carbidopa/levodopa 25/100 was increased last visit so that she is not taking 2 tablets 3 times per day.  She initially had trouble with this titration because of nausea and vomiting when she got up to the 2 tablets 3 times per day, so she was told to try taking one tablet 6 times per day.  She was able to get up to carbidopa/levodopa 25/100, 2 po tid.  One fall in the laundry room after slipping.  She had a swallow study on 11/14/2015 and this demonstrated mild pharyngeal phase dysphagia.  No treatment recommendations were made or felt necessary.  She was given swallowing precautions.  She does tell me that a few days ago she was eating salad and began to choke.  Lasted few min.   She dropped off her living will that our office since last visit.  It said that she could have hydration and mentioned to her power of attorney was for healthcare.  It did say that life-prolonging measures could be withheld or withdrawn if she was unable to make decisions.  I clarified this with her and she wanted no CPR with true DNR but husband felt uncomfortable and they decided to talk it over more and let me know in future.  She saw Dr. Dione Barnes a few weeks and told that everything looks okay.  No diplopia.   Having L leg pain.  Trouble in the hip and cramping.  Worse when trying to get in car and has to flex hip.  Saw Dr. Charlann Barnes and given a shot that last a few days.  Feels like a "muscle spasm" when gets in bed.  Has appt with Dr. Charlann Barnes next week.  Worried because has to fly to funeral this weekend.  Cannot take NSAIDs (states that she was told that after colonoscopy).  Requests something for the pain.     ALLERGIES:   Allergies  Allergen Reactions  . Azithromycin   . Clindamycin/Lincomycin     CURRENT MEDICATIONS:  Outpatient Encounter Prescriptions as of 01/08/2016  Medication Sig  . aspirin 81 MG  tablet Take 81 mg by mouth daily.  . carbidopa-levodopa (SINEMET IR) 25-100 MG tablet Take 2 tablets by mouth 3 (three) times daily.  . cetirizine (ZYRTEC) 10 MG tablet Take 10 mg by mouth daily.  . cholecalciferol (VITAMIN D) 1000 units tablet Take 5,000 Units by mouth daily.  . Cyanocobalamin (VITAMIN B-12) 2500 MCG SUBL Place under the tongue.  . furosemide (LASIX) 40 MG tablet Take 40 mg by mouth.  . levothyroxine (SYNTHROID, LEVOTHROID) 75  MCG tablet Take 75 mcg by mouth daily before breakfast.  . omeprazole (PRILOSEC) 20 MG capsule Take 20 mg by mouth daily.  . simvastatin (ZOCOR) 10 MG tablet Take 10 mg by mouth daily.   No facility-administered encounter medications on file as of 01/08/2016.     PAST MEDICAL HISTORY:   Past Medical History:  Diagnosis Date  . Hyperlipidemia   . Hypertension   . Hypothyroidism   . Parkinson's disease (HCC)     PAST SURGICAL HISTORY:   Past Surgical History:  Procedure Laterality Date  . ABDOMINAL HYSTERECTOMY    . APPENDECTOMY      SOCIAL HISTORY:   Social History   Social History  . Marital status: Married    Spouse name: N/A  . Number of children: N/A  . Years of education: N/A   Occupational History  . 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 Smoking status: Never Smoker  . Smokeless tobacco: Not on file  . Alcohol use No  . Drug use: No  . Sexual activity: Not on file   Other Topics Concern  . Not on file   Social History Narrative  . No narrative on file    FAMILY HISTORY:   Family Status  Relation Status  . Mother Deceased   HTN; unknown cause of death  . Father Deceased   Angina, DM  . Sister Alive   2, both with DM  . Brother Alive   DM  . Child Alive   2, adopted, healthy    ROS:  A complete 10 system review of systems was obtained and was unremarkable apart from what is mentioned above.  PHYSICAL EXAMINATION:    VITALS:   Vitals:   01/08/16 1049   BP: (!) 158/80  Pulse: 86  Height: 5' (1.524 m)   Pt refused to weigh  GEN:  The patient appears stated age and is in NAD. HEENT:  Normocephalic, atraumatic.  The mucous membranes are moist. The superficial temporal arteries are without ropiness or tenderness.  No tongue fasciculations CV:  RRR Lungs:  CTAB Neck/HEME:  There are no carotid bruits bilaterally.  Neurological examination:  Orientation: The patient is alert and oriented x3.  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    Extraocular muscles are intact with the exception of mild upgaze paresis.  She is also somewhat apraxic when asked to perform EOM testing.  There were a few square wave jerks.   The visual fields are full to confrontational testing. The speech is fluent and clear. Minimal trouble with gutteral sounds.  She is mildly hypophonic.  Soft palate rises symmetrically and there is no tongue deviation. Hearing is intact to conversational tone. Sensation: Sensation is intact to light touch throughout Motor: Strength is 5/5 in the bilateral upper and lower extremities.   Shoulder shrug is equal and symmetric.  There is no pronator drift.  No fasciculations noted across the chest, back, arms or legs.   Movement examination: Tone: There is good tone today Abnormal movements: no tremor noted.. Coordination:  Pt does have slowness of all forms of RAMs on the L but is improved compared to last 2 visits Gait and Station: The patient has minimal difficulty arising out of a deep-seated chair without the use of the hands.  She walks better than last visit although she is still slow.  Less dragging of the L leg.    ASSESSMENT/PLAN:  1.  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  Clinically is a little better on the medication than off of it.  -We talked about prognosis.  Talked about LOC orders again and since she and her husband disagreed, they were going to talk it over before marking her chart as DNR  (her wishes)  -asked me about PCP recommendation and given Dr. Rennis Golden name 2.  dysphagia  -She had a swallow study on 11/14/2015 and this demonstrated mild pharyngeal phase dysphagia.  No treatment recommendations were made or felt necessary.  However, choked on salad recently and I would recommend no more leafy salads.  Cut up meat and chew extensively. 3.  L hip pain  -had ortho eval and injection helped but only for few days.  Has f/u with Dr. Charlann Barnes next week  -discussed ultram 50 mg as going to funeral and having to fly and doesn't think will be able to tolerate pain.  She thinks that she may have some at home.  If not, will call me and I will call her in enough to get to Dr. Boneta Lucks appt.  Talked to her about lowered sz threshold with this med.  Can take up to tid. 4.  Follow up is anticipated in the next few months, sooner should new neurologic issues arise.  Much greater than 50% of this visit was spent in counseling and coordinating care.  Total face to face time:  25 min

## 2016-01-08 ENCOUNTER — Ambulatory Visit (INDEPENDENT_AMBULATORY_CARE_PROVIDER_SITE_OTHER): Payer: Medicare Other | Admitting: Neurology

## 2016-01-08 ENCOUNTER — Encounter: Payer: Self-pay | Admitting: Neurology

## 2016-01-08 VITALS — BP 158/80 | HR 86 | Ht 60.0 in

## 2016-01-08 DIAGNOSIS — R1310 Dysphagia, unspecified: Secondary | ICD-10-CM | POA: Diagnosis not present

## 2016-01-08 DIAGNOSIS — G231 Progressive supranuclear ophthalmoplegia [Steele-Richardson-Olszewski]: Secondary | ICD-10-CM | POA: Diagnosis not present

## 2016-01-08 DIAGNOSIS — M25552 Pain in left hip: Secondary | ICD-10-CM | POA: Diagnosis not present

## 2016-01-08 NOTE — Patient Instructions (Signed)
Lezlie OctaveKate Tabori, MD [phone]

## 2016-02-04 ENCOUNTER — Ambulatory Visit: Payer: Medicare Other | Admitting: Neurology

## 2016-02-08 ENCOUNTER — Telehealth: Payer: Self-pay | Admitting: Neurology

## 2016-02-08 NOTE — Telephone Encounter (Signed)
Patient dropped off a typed piece of paper (which likely was copied from her living will).  It basically states that she does NOT wish to be DO NOT RESUSCITATE.  She does state that she does not want attempts to prolong her life by artificial means if recovery is not deemed possible, but otherwise in an acute event, she wants all rescue efforts.  Will send document to scanning.

## 2016-03-21 ENCOUNTER — Other Ambulatory Visit: Payer: Self-pay | Admitting: Neurology

## 2016-03-24 ENCOUNTER — Telehealth: Payer: Self-pay | Admitting: Neurology

## 2016-03-24 MED ORDER — CARBIDOPA-LEVODOPA 25-100 MG PO TABS: 2.0000 | ORAL_TABLET | Freq: Three times a day (TID) | ORAL | 1 refills | Status: DC

## 2016-03-24 NOTE — Telephone Encounter (Signed)
Shelley Barnes [birth year]. She called needing to get a refill on her Carbidopa Levodopa medication. The prescription # is [id number].  She uses [facility] [phone]). She is a patient of Dr. Arbutus Leasat.  Thank you

## 2016-03-24 NOTE — Telephone Encounter (Signed)
Refill sent to pharmacy. Per last OV note patient to continue.

## 2016-03-24 NOTE — Telephone Encounter (Signed)
Pt scheduled for 05/05/16 so 30 day supply with 1 refill sent in.

## 2016-03-28 ENCOUNTER — Encounter: Payer: Self-pay | Admitting: Neurology

## 2016-03-31 ENCOUNTER — Other Ambulatory Visit: Payer: Self-pay | Admitting: Nurse Practitioner

## 2016-03-31 ENCOUNTER — Encounter: Payer: Self-pay | Admitting: Family Medicine

## 2016-03-31 DIAGNOSIS — S32010A Wedge compression fracture of first lumbar vertebra, initial encounter for closed fracture: Secondary | ICD-10-CM

## 2016-04-08 ENCOUNTER — Ambulatory Visit
Admission: RE | Admit: 2016-04-08 | Discharge: 2016-04-08 | Disposition: A | Discharge status: Home / Self Care | Payer: Medicare Other | Source: Ambulatory Visit | Attending: Nurse Practitioner | Admitting: Nurse Practitioner

## 2016-04-08 DIAGNOSIS — S32010A Wedge compression fracture of first lumbar vertebra, initial encounter for closed fracture: Secondary | ICD-10-CM

## 2016-04-08 HISTORY — PX: IR GENERIC HISTORICAL: IMG1180011

## 2016-04-08 NOTE — Consult Note (Signed)
Chief Complaint: I have a fracture.  Referring Physician(s): Turbyfill,Holly I  History of Present Illness: Shelley Barnes is a 75 y.o. female presenting today as a scheduled appointment to vascular & interventional radiology clinic, kindly referred by Dr. Jen Mowurbyfill, to discuss a recent diagnosis of compression fracture, and her potential candidacy for vertebral augmentation.  She presents today with her husband for the interview. She tells me she sustained a fall on Thanksgiving of this year, 03/20/2016. She did not recognize that she had pain until the following morning on Friday the 24th. Her husband and her tell me that she did not make much of the back pain until a few days later, when she mentioned it to her physical therapist who then suggested she be evaluated by her primary care.  Her complaint led to a plain film series of the thoracic spine performed 03/27/2016 which does demonstrate a compression fracture of the lumbar 1 level. There is no comparison for this study and she has not yet had cross-sectional imaging with CT or MRI.  After the diagnosis was made, she was initiated on a combination of pain medication and muscle relaxant. She tells me that the pain medicine combination did help her pain. In fact, she has not been taking these medicines for the past 2 days and feels like her pain is now it is 0. The initial pain level she tells me was a 10/10 intensity with sharp quality in the mid back.  Recently, she has return to all of her activities of daily living, and feels as if she can complete all of her activities as she once had. She does not feel restricted at all at this time.  She does use a walker for ambulation, though this is not new after her injury.  She lives nearby with her husband. They do not have children locally, though do have 2 children in [place].  She has had no prior spine surgery, with her only surgery being a hysterectomy at the  age of 75 and a childhood appendectomy. Otherwise she is quite healthy.  Past Medical History:  Diagnosis Date  . Hyperlipidemia   . Hypertension   . Hypothyroidism   . Parkinson's disease [facility](HCC)     Past Surgical History:  Procedure Laterality Date  . ABDOMINAL HYSTERECTOMY    . APPENDECTOMY      Allergies: Azithromycin and Clindamycin/lincomycin  Medications: Prior to Admission medications   Medication Sig Start Date End Date Taking? Authorizing Provider  aspirin 81 MG tablet Take 81 mg by mouth daily.   Yes Historical Provider, MD  calcitonin, salmon, (MIACALCIN/FORTICAL) 200 UNIT/ACT nasal spray Instill spray into one nostril daily, alternating nostrils. 03/28/16 05/02/16 Yes Historical Provider, MD  carbidopa-levodopa (SINEMET IR) 25-100 MG tablet Take 2 tablets by mouth 3 (three) times daily. 03/24/16  Yes Rebecca S Tat, DO  cetirizine (ZYRTEC) 10 MG tablet Take 10 mg by mouth daily.   Yes Historical Provider, MD  cholecalciferol (VITAMIN D) 1000 units tablet Take 5,000 Units by mouth daily.   Yes Historical Provider, MD  Cyanocobalamin (VITAMIN B-12) 2500 MCG SUBL Place under the tongue.   Yes Historical Provider, MD  furosemide (LASIX) 40 MG tablet Take 40 mg by mouth.   Yes Historical Provider, MD  HYDROcodone-acetaminophen (NORCO/VICODIN) 5-325 MG tablet Take by mouth. 03/28/16  Yes Historical Provider, MD  levothyroxine (SYNTHROID, LEVOTHROID) 75 MCG tablet Take 75 mcg by mouth daily before breakfast.   Yes Historical Provider, MD  methocarbamol (ROBAXIN) 500 MG tablet Take 500 mg by mouth. 03/26/16  Yes Historical Provider, MD  omeprazole (PRILOSEC) 20 MG capsule Take 20 mg by mouth daily.   Yes Historical Provider, MD  simvastatin (ZOCOR) 10 MG tablet Take 10 mg by mouth daily.   Yes Historical Provider, MD  celecoxib (CELEBREX) 200 MG capsule Take 200 mg by mouth.    Historical Provider, MD     No family history on file.  Social History   Social History  . Marital  status: Married    Spouse name: N/A  . Number of children: N/A  . Years of education: N/A   Occupational History  . 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 Smoking status: Never Smoker  . Smokeless tobacco: Not on file  . Alcohol use No  . Drug use: No  . Sexual activity: Not on file   Other Topics Concern  . Not on file   Social History Narrative  . No narrative on file      Review of Systems: A 12 point ROS discussed and pertinent positives are indicated in the HPI above.  All other systems are negative.  Review of Systems  Vital Signs: BP (!) 176/82   Pulse 74   Resp 16   Ht 5' (1.524 m)   Wt 175 lb (79.4 kg)   SpO2 98%   BMI 34.18 kg/m   Physical Exam Atraumatic normocephalic mucous membranes moist pink. Wearing glasses. Conjugate gaze. No scleral icterus or scleral injection. She uses a walker for ambulation. Moves all 4 extremities grossly with gross motor and gross sensory intact. Symmetric excursion of the chest on inspiration and expiration. No labored breathing. Abdomen soft nontender. Targeted exam of the back demonstrates no reproducible pain on direct palpation. Genitourinary deferred.  Mallampati Score:  2  Imaging: No results found.  Labs:  CBC: No results for input(s): WBC, HGB, HCT, PLT in the last 8760 hours.  COAGS: No results for input(s): INR, APTT in the last 8760 hours.  BMP: No results for input(s): NA, K, CL, CO2, GLUCOSE, BUN, CALCIUM, CREATININE, GFRNONAA, GFRAA in the last 8760 hours.  Invalid input(s): CMP  LIVER FUNCTION TESTS: No results for input(s): BILITOT, AST, ALT, ALKPHOS, PROT, ALBUMIN in the last 8760 hours.  TUMOR MARKERS: No results for input(s): AFPTM, CEA, CA199, CHROMGRNA in the last 8760 hours.  Assessment and Plan:  [name] is a 75 yo female with recent diagnosis of L1 compression fracture on thoracic plain films series.  Although she did have  significant pain at the time of the injury, she now tells me she has returned to baseline of 0/10 pain without the use of pain medicines. She is return to all of her activities of daily living.  I discussed with her the goals of vertebral augmentation, which are to decrease pain level, restore function, and decrease the need for pain medicines. My impression is that at this time we cannot improve her pain level anymore, and there is no need to offer augmentation given her excellent recovery.  My suggestion is that we can defer any further imaging for the time being unless she had regression of her symptoms and concern for any ongoing/chronic component that we could treat should her symptoms worsen.  She agrees with this plan, and is satisfied to continue her conservative management.  Plan: We will not schedule her for a follow-up appointment unless something changes.  We will defer any cross-sectional imaging unless her symptoms recur or worsen. She knows she can contact our office directly or through her referring physician should we need to pursue any imaging and discuss intervention.  Thank you for this interesting consult.  I greatly enjoyed meeting TEREN FRANCKOWIAK and look forward to participating in their care.  A copy of this report was sent to the requesting provider on this date.  Electronically Signed: Gilmer Mor 04/08/2016, 12:10 PM   I spent a total of  40 Minutes   in face to face in clinical consultation, greater than 50% of which was counseling/coordinating care for L1 compression fracture, and evaluation for possible vertebral augmentation.

## 2016-05-01 NOTE — Progress Notes (Signed)
Shelley Barnes was seen today in the movement disorders clinic for neurologic consultation.  Her PCP is [name], [name], MD.   The patient is seen today as a second opinion from [name].  I have reviewed Dr. [name]'s records.  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  The patient was first seen by [name] in October, 2016 with a complaint of left-sided weakness.  Pt states that sx's started about one year ago.  She states that she noted difficulty getting in and out of a chair and difficulty bending over.  She agrees that she has L sided "weakness" and had trouble getting in and out of exam shorts today because she had trouble lifting the L leg.    The patient was started on levodopa by [name], but was not able to tolerate more than carbidopa/levodopa 25/100, 1 tablet 4 times per day.   Pt states that her husband told her that the medication helped but the patient was not convinced.   Dosages higher than this caused nausea (tried 2 tablets at a time).  She admits that she has dropped the medication to one at night only for "jittery legs."   She refused DaT scanning per the records.    The patient had an MRI of the brain with and without gadolinium on 12/18/2014.  There was evidence of moderate atrophy and white matter disease.  I don't have films for my review.     07/26/15 update:  The patient follows up today.  This patient is accompanied in the office by her spouse who supplements the history.  She went back to [name] since our last visit and had an EMG.  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  This was reported to show mildly reduced activation in all leg muscles, consistent with either poor effort or upper motor neuron lesion.  She has previously had an MRI of the brain in August, 2016 that apparently showed atrophy and moderate white matter disease.  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  There was a disc protrusion at the C6-C7 level but no cord deformity.  I do not have those films, but only the report.  She subsequently saw neurosurgery on 07/23/2015.  I reviewed those notes, although I am not sure if they are complete.  There was no plan listed, only a diagnosis.  They tell me today that the neurosurgeon told them that they were told that there was no myelopathy as the cause of the sx's.  They express that neurosurgeon was concerned that the "tongue was vibrating."  Husband and patient states that she is the same since last visit.  No falls.  No swallowing.    08/22/15 update:  The patient follows up today, accompanied by her husband who supplements the history.  Since our last visit, she did have a DaT scan.  The study was limited by motion, but it was felt to be interpretable.  There was asymmetric uptake, grade 1, with normal uptake on the left and decreased uptake in the right putamen.  This does correlate with her symptoms of decreased coordination on the left.  11/02/15 update:  The patient follows up today, accompanied by her husband who supplements the history.  Since our last visit, she got started on low dose levodopa and has worked to 1 po tid.  She had SE in past when at 2 po qid but she didn't titrate upward very slow.  She did PT/OT since our last visit.  She has completed OT but is still doing PT.  OT helped with strength.  PT she hasn't found helpful.   Had one fall getting out of beauty shop chair and tripped over the bar that raises the chair and fell back.  Slipped on tile in [place].  Did not get hurt with either of these.  Didn't have walker with either.    No hallucinations.  No syncope.  Went to symposium in [place] and learned a lot.  Husband states coughing a lot with eating.  Also hip  pain.  Xray neg.    01/08/16 update:  The patient follows up today, accompanied by her husband who supplements the history.  Her carbidopa/levodopa 25/100 was increased last visit so that she is not taking 2 tablets 3 times per day.  She initially had trouble with this titration because of nausea and vomiting when she got up to the 2 tablets 3 times per day, so she was told to try taking one tablet 6 times per day.  She was able to get up to carbidopa/levodopa 25/100, 2 po tid.  One fall in the laundry room after slipping.  She had a swallow study on 11/14/2015 and this demonstrated mild pharyngeal phase dysphagia.  No treatment recommendations were made or felt necessary.  She was given swallowing precautions.  She does tell me that a few days ago she was eating salad and began to choke.  Lasted few min.   She dropped off her living will that our office since last visit.  It said that she could have hydration and mentioned to her power of attorney was for healthcare.  It did say that life-prolonging measures could be withheld or withdrawn if she was unable to make decisions.  I clarified this with her and she wanted no CPR with true DNR but husband felt uncomfortable and they decided to talk it over more and let me know in future.  She saw Dr. Groat a few weeks and told that everything looks okay.  No diplopia.   Having L leg pain.  Trouble in the hip and cramping.  Worse when trying to get in car and has to flex hip.  Saw Dr. Olin and given a shot that last a few days.  Feels like a "muscle spasm" when gets in bed.  Has appt with Dr. Olin next week.  Worried because has to fly to funeral this weekend.  Cannot take NSAIDs (states that she was told that after colonoscopy).  Requests something for the pain.    05/05/16 update:  The patient follows up today, accompanied by her husband who supplements the history.  On carbidopa/levodopa 25/100, 2 tablets 3 times per day.  Has some trouble remembering last dose but "my  legs remind me that I forgot."  Reviewed records available to me since last visit.  She fell Thanksgiving night.  Fell backward.  She was evaluated about one week later.  She had x-rays of the thoracic spine which did not reveal any acute fracture, but there was 20% compression of L1, which was new compared to 2016.  Some lightheadedness when first gets up but resolves quickly.    No choking episodes but is coughing with liquids and some   with saliva per husband.  States that she was in PT but was released about 2 weeks ago.  Back feels much better and back to normal.  Doing therapy exercises at home.  Mood has been fair.     ALLERGIES:   Allergies  Allergen Reactions  . Azithromycin Nausea And Vomiting  . Clindamycin/Lincomycin Nausea And Vomiting    CURRENT MEDICATIONS:  Outpatient Encounter Prescriptions as of 05/05/2016  Medication Sig  . aspirin 81 MG tablet Take 81 mg by mouth daily.  . carbidopa-levodopa (SINEMET IR) 25-100 MG tablet Take 2 tablets by mouth 3 (three) times daily.  . cetirizine (ZYRTEC) 10 MG tablet Take 10 mg by mouth daily.  . cholecalciferol (VITAMIN D) 1000 units tablet Take 5,000 Units by mouth daily.  . Cyanocobalamin (VITAMIN B-12) 2500 MCG SUBL Place under the tongue.  . furosemide (LASIX) 40 MG tablet Take 40 mg by mouth.  . levothyroxine (SYNTHROID, LEVOTHROID) 75 MCG tablet Take 75 mcg by mouth daily before breakfast.  . omeprazole (PRILOSEC) 20 MG capsule Take 20 mg by mouth daily.  . simvastatin (ZOCOR) 10 MG tablet Take 10 mg by mouth daily.  . [EXPIRED] calcitonin, salmon, (MIACALCIN/FORTICAL) 200 UNIT/ACT nasal spray Instill spray into one nostril daily, alternating nostrils.  . [DISCONTINUED] celecoxib (CELEBREX) 200 MG capsule Take 200 mg by mouth.  . [DISCONTINUED] HYDROcodone-acetaminophen (NORCO/VICODIN) 5-325 MG tablet Take by mouth.  . [DISCONTINUED] methocarbamol (ROBAXIN) 500 MG tablet Take 500 mg by mouth.   No facility-administered encounter  medications on file as of 05/05/2016.     PAST MEDICAL HISTORY:   Past Medical History:  Diagnosis Date  . Hyperlipidemia   . Hypertension   . Hypothyroidism   . Parkinson's disease (HCC)     PAST SURGICAL HISTORY:   Past Surgical History:  Procedure Laterality Date  . ABDOMINAL HYSTERECTOMY    . APPENDECTOMY    . IR GENERIC HISTORICAL  04/08/2016   IR RADIOLOGIST EVAL & MGMT 04/08/2016 Gilmer Mor, DO [facility] INTERV RAD    SOCIAL HISTORY:   Social History   Social History  . Marital status: Married    Spouse name: N/A  . Number of children: N/A  . Years of education: N/A   Occupational History  . retired     Engineer, site; Research officer, political party; Print production planner   Social History Main Topics  . Smoking status: Never Smoker  . Smokeless tobacco: Not on file  . Alcohol use No  . Drug use: No  . Sexual activity: Not on file   Other Topics Concern  . Not on file   Social History Narrative  . No narrative on file    FAMILY HISTORY:   Family Status  Relation Status  . Mother Deceased   HTN; unknown cause of death  . Father Deceased   Angina, DM  . Sister Alive   2, both with DM  . Brother Alive   DM  . Child Alive   2, adopted, healthy    ROS:  A complete 10 system review of systems was obtained and was unremarkable apart from what is mentioned above.  PHYSICAL EXAMINATION:    VITALS:   Vitals:   05/05/16 1055  BP: (!) 144/80  Pulse: 81  SpO2: 95%   Pt refused to weigh  GEN:  The patient appears stated age and is in NAD. HEENT:  Normocephalic, atraumatic.  The mucous membranes are moist. The superficial temporal arteries are without ropiness or tenderness.  No tongue fasciculations  CV:  RRR Lungs:  CTAB Neck/HEME:  There are no carotid bruits bilaterally.  Neurological examination:  Orientation: The patient is alert and oriented x3.  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    Extraocular muscles are intact with the exception of mild upgaze paresis.   She is also somewhat apraxic when asked to perform EOM testing.  There were a few square wave jerks.   The visual fields are full to confrontational testing. The speech is fluent and clear. Minimal trouble with gutteral sounds.  She is mildly hypophonic.  Soft palate rises symmetrically and there is no tongue deviation. Hearing is intact to conversational tone. Sensation: Sensation is intact to light touch throughout Motor: Strength is 5/5 in the bilateral upper and lower extremities.   Shoulder shrug is equal and symmetric.  There is no pronator drift.  No fasciculations noted across the chest, back, arms or legs.   Movement examination: Tone: There is good tone today Abnormal movements: no tremor noted.. Coordination:  Pt does have slowness of all forms of RAMs on the L but is improved compared to last 2 visits Gait and Station: The patient has minimal difficulty arising out of a deep-seated chair without the use of the hands.  She walks better than last visit although she is still slow.  Less dragging of the L leg.    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  Clinically is a little better on the medication than off of it.  -brought in living will and is not DNR but may withhold lifeprolonging measures if she is unable to make decisions.    -walker at all times even in the home 2.  dysphagia  -She had a swallow study on 11/14/2015 and this demonstrated mild pharyngeal phase dysphagia.  No treatment recommendations were made or felt necessary.   3.  L hip pain  -doing better 4.  Compression fx, lumbar  -better after PT 5.  Follow up is anticipated in the next 4 months, sooner should new neurologic issues arise.  Much greater than 50% of this visit was spent in counseling and coordinating care.  Total face to face time:  25 min

## 2016-05-02 ENCOUNTER — Encounter: Payer: Self-pay | Admitting: Interventional Radiology

## 2016-05-05 ENCOUNTER — Ambulatory Visit (INDEPENDENT_AMBULATORY_CARE_PROVIDER_SITE_OTHER): Payer: Medicare Other | Admitting: Neurology

## 2016-05-05 ENCOUNTER — Encounter: Payer: Self-pay | Admitting: Neurology

## 2016-05-05 VITALS — BP 144/80 | HR 81

## 2016-05-05 DIAGNOSIS — G231 Progressive supranuclear ophthalmoplegia [Steele-Richardson-Olszewski]: Secondary | ICD-10-CM | POA: Diagnosis not present

## 2016-05-05 DIAGNOSIS — S32010A Wedge compression fracture of first lumbar vertebra, initial encounter for closed fracture: Secondary | ICD-10-CM

## 2016-05-05 NOTE — Patient Instructions (Signed)
1. Increase Levodopa to 3 tablets in the morning, 2 tablets in the afternoon, 2 tablets in the evening.

## 2016-05-20 ENCOUNTER — Ambulatory Visit: Payer: Medicare Other | Admitting: Family Medicine

## 2016-05-21 ENCOUNTER — Telehealth: Payer: Self-pay | Admitting: Neurology

## 2016-05-21 MED ORDER — CARBIDOPA-LEVODOPA 25-100 MG PO TABS: ORAL_TABLET | ORAL | 5 refills | Status: DC

## 2016-05-21 NOTE — Telephone Encounter (Signed)
Pt phone number  [phone] patient need her carbidopa levodopa she takes 7 pills a day she uses [facility]

## 2016-05-21 NOTE — Telephone Encounter (Signed)
RX sent to pharmacy  

## 2016-08-06 ENCOUNTER — Encounter: Payer: Self-pay | Admitting: Neurology

## 2016-08-07 ENCOUNTER — Encounter: Payer: Self-pay | Admitting: Neurology

## 2016-08-08 NOTE — Telephone Encounter (Signed)
Tried to call patient back to see if they received her mychart message with no answer.

## 2016-09-01 NOTE — Progress Notes (Deleted)
  Shelley Barnes was seen today in the movement disorders clinic for neurologic consultation.  Her PCP is [name], Michael J, MD.   The patient is seen today as a second opinion from Dr. Willis.  I have reviewed Dr. Willis's records.  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  The patient was first seen by Dr. Willis in October, 2016 with a complaint of left-sided weakness.  Pt states that sx's started about one year ago.  She states that she noted difficulty getting in and out of a chair and difficulty bending over.  She agrees that she has L sided "weakness" and had trouble getting in and out of exam shorts today because she had trouble lifting the L leg.    The patient was started on levodopa by Dr. Willis, but was not able to tolerate more than carbidopa/levodopa 25/100, 1 tablet 4 times per day.   Pt states that her husband told her that the medication helped but the patient was not convinced.   Dosages higher than this caused nausea (tried 2 tablets at a time).  She admits that she has dropped the medication to one at night only for "jittery legs."   She refused DaT scanning per the records.    The patient had an MRI of the brain with and without gadolinium on 12/18/2014.  There was evidence of moderate atrophy and white matter disease.  I don't have films for my review.     07/26/15 update:  The patient follows up today.  This patient is accompanied in the office by her spouse who supplements the history.  She went back to Dr. Willis since our last visit and had an EMG.  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  This was reported to show mildly reduced activation in all leg muscles, consistent with either poor effort or upper motor neuron lesion.  She has previously had an MRI of the brain in August, 2016 that apparently showed atrophy and moderate white matter disease.  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  There was a disc protrusion at the C6-C7 level but no cord deformity.  I do not have those films, but only the report.  She subsequently saw neurosurgery on 07/23/2015.  I reviewed those notes, although I am not sure if they are complete.  There was no plan listed, only a diagnosis.  They tell me today that the neurosurgeon told them that they were told that there was no myelopathy as the cause of the sx's.  They express that neurosurgeon was concerned that the "tongue was vibrating."  Husband and patient states that she is the same since last visit.  No falls.  No swallowing.    08/22/15 update:  The patient follows up today, accompanied by her husband who supplements the history.  Since our last visit, she did have a DaT scan.  The study was limited by motion, but it was felt to be interpretable.  There was asymmetric uptake, grade 1, with normal uptake on the left and decreased uptake in the right putamen.  This does correlate with her symptoms of decreased coordination on the left.  11/02/15 update:  The patient follows up today, accompanied by her husband who supplements the history.    Since our last visit, she got started on low dose levodopa and has worked to 1 po tid.  She had SE in past when at 2 po qid but she didn't titrate upward very slow.  She did PT/OT since our last visit.  She has completed OT but is still doing PT.  OT helped with strength.  PT she hasn't found helpful.   Had one fall getting out of beauty shop chair and tripped over the bar that raises the chair and fell back.  Slipped on tile in [place].  Did not get hurt with either of these.  Didn't have walker with either.    No hallucinations.  No syncope.  Went to symposium in [place] and learned a lot.  Husband states coughing a lot with eating.  Also hip  pain.  Xray neg.    01/08/16 update:  The patient follows up today, accompanied by her husband who supplements the history.  Her carbidopa/levodopa 25/100 was increased last visit so that she is not taking 2 tablets 3 times per day.  She initially had trouble with this titration because of nausea and vomiting when she got up to the 2 tablets 3 times per day, so she was told to try taking one tablet 6 times per day.  She was able to get up to carbidopa/levodopa 25/100, 2 po tid.  One fall in the laundry room after slipping.  She had a swallow study on 11/14/2015 and this demonstrated mild pharyngeal phase dysphagia.  No treatment recommendations were made or felt necessary.  She was given swallowing precautions.  She does tell me that a few days ago she was eating salad and began to choke.  Lasted few min.   She dropped off her living will that our office since last visit.  It said that she could have hydration and mentioned to her power of attorney was for healthcare.  It did say that life-prolonging measures could be withheld or withdrawn if she was unable to make decisions.  I clarified this with her and she wanted no CPR with true DNR but husband felt uncomfortable and they decided to talk it over more and let me know in future.  She saw Dr. Groat a few weeks and told that everything looks okay.  No diplopia.   Having L leg pain.  Trouble in the hip and cramping.  Worse when trying to get in car and has to flex hip.  Saw Dr. Olin and given a shot that last a few days.  Feels like a "muscle spasm" when gets in bed.  Has appt with Dr. Olin next week.  Worried because has to fly to funeral this weekend.  Cannot take NSAIDs (states that she was told that after colonoscopy).  Requests something for the pain.    05/05/16 update:  The patient follows up today, accompanied by her husband who supplements the history.  On carbidopa/levodopa 25/100, 2 tablets 3 times per day.  Has some trouble remembering last dose but "my  legs remind me that I forgot."  Reviewed records available to me since last visit.  She fell Thanksgiving night.  Fell backward.  She was evaluated about one week later.  She had x-rays of the thoracic spine which did not reveal any acute fracture, but there was 20% compression of L1, which was new compared to 2016.  Some lightheadedness when first gets up but resolves quickly.    No choking episodes but is coughing with liquids and some   with saliva per husband.  States that she was in PT but was released about 2 weeks ago.  Back feels much better and back to normal.  Doing therapy exercises at home.  Mood has been fair.    09/02/16 update: Patient seen today in follow-up, accompanied by her husband who supplements the history.  I have reviewed multiple records since our last visit.  She was in the emergency room on 08/05/2016.  She had fallen and fx her distal radius.  She had sx on 08/12/16.  She has *** recovered.  She is still on carbidopa/levodopa 25/100, 2 tablets 3 times per day.  After she fell and fractured her wrist, I told the patient's husband when he emailed that I thought it was best that she stopped walking and uses a wheelchair at all times given the risk for falls and fractures.  She has not had any hallucinations.  No lightheadedness or near syncope.  No choking.   ALLERGIES:   Allergies  Allergen Reactions  . Azithromycin Nausea And Vomiting  . Clindamycin/Lincomycin Nausea And Vomiting    CURRENT MEDICATIONS:  Outpatient Encounter Prescriptions as of 09/02/2016  Medication Sig  . aspirin 81 MG tablet Take 81 mg by mouth daily.  . carbidopa-levodopa (SINEMET IR) 25-100 MG tablet 3 in the morning, 2 in the afternoon, 2 in the evening  . cetirizine (ZYRTEC) 10 MG tablet Take 10 mg by mouth daily.  . cholecalciferol (VITAMIN D) 1000 units tablet Take 5,000 Units by mouth daily.  . Cyanocobalamin (VITAMIN B-12) 2500 MCG SUBL Place under the tongue.  . furosemide (LASIX) 40 MG tablet  Take 40 mg by mouth.  . levothyroxine (SYNTHROID, LEVOTHROID) 75 MCG tablet Take 75 mcg by mouth daily before breakfast.  . omeprazole (PRILOSEC) 20 MG capsule Take 20 mg by mouth daily.  . simvastatin (ZOCOR) 10 MG tablet Take 10 mg by mouth daily.   No facility-administered encounter medications on file as of 09/02/2016.     PAST MEDICAL HISTORY:   Past Medical History:  Diagnosis Date  . Hyperlipidemia   . Hypertension   . Hypothyroidism   . Parkinson's disease (HCC)     PAST SURGICAL HISTORY:   Past Surgical History:  Procedure Laterality Date  . ABDOMINAL HYSTERECTOMY    . APPENDECTOMY    . IR GENERIC HISTORICAL  04/08/2016   IR RADIOLOGIST EVAL & MGMT 04/08/2016 Gilmer MorJaime Wagner, DO [facility] INTERV RAD    SOCIAL HISTORY:   Social History   Social History  . Marital status: Married    Spouse name: N/A  . Number of children: N/A  . Years of education: N/A   Occupational History  . 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 Smoking status: Never Smoker  . Smokeless tobacco: Not on file  . Alcohol use No  . Drug use: No  . Sexual activity: Not on file   Other Topics Concern  . Not on file   Social History Narrative  . No narrative on file    FAMILY HISTORY:   Family Status  Relation Status  . Mother Deceased   HTN; unknown cause of death  . Father Deceased   Angina, DM  . Sister Alive   2, both with DM  . Brother Alive   DM  . Child Alive   2, adopted, healthy    ROS:  A complete 10 system review of systems was obtained and was  unremarkable apart from what is mentioned above.  PHYSICAL EXAMINATION:    VITALS:   There were no vitals filed for this visit. Pt refused to weigh  GEN:  The patient appears stated age and is in NAD. HEENT:  Normocephalic, atraumatic.  The mucous membranes are moist. The superficial temporal arteries are without ropiness or tenderness.  No tongue fasciculations CV:   RRR Lungs:  CTAB Neck/HEME:  There are no carotid bruits bilaterally.  Neurological examination:  Orientation: The patient is alert and oriented x3.  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    Extraocular muscles are intact with the exception of mild upgaze paresis.  She is also somewhat apraxic when asked to perform EOM testing.  There were a few square wave jerks.   The visual fields are full to confrontational testing. The speech is fluent and clear. Minimal trouble with gutteral sounds.  She is mildly hypophonic.  Soft palate rises symmetrically and there is no tongue deviation. Hearing is intact to conversational tone. Sensation: Sensation is intact to light touch throughout Motor: Strength is 5/5 in the bilateral upper and lower extremities.   Shoulder shrug is equal and symmetric.  There is no pronator drift.  No fasciculations noted across the chest, back, arms or legs.   Movement examination: Tone: There is good tone today Abnormal movements: no tremor noted.. Coordination:  Pt does have slowness of all forms of RAMs on the L but is improved compared to last 2 visits Gait and Station: The patient has minimal difficulty arising out of a deep-seated chair without the use of the hands.  She walks better than last visit although she is still slow.  Less dragging of the L leg.    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  Clinically is a little better on the medication than off of it.  -brought in living will and is not DNR but may withhold lifeprolonging measures if she is unable to make decisions.    -walker at all times even in the home 2.  dysphagia  -She had a swallow study on 11/14/2015 and this demonstrated mild pharyngeal phase dysphagia.  No treatment recommendations were made or felt necessary.   3.  L hip pain  -doing better 4.  Compression fx, lumbar  -better after PT 5.  Follow up is anticipated in the next 4 months, sooner  should new neurologic issues arise.  Much greater than 50% of this visit was spent in counseling and coordinating care.  Total face to face time:  25 min

## 2016-09-02 ENCOUNTER — Ambulatory Visit: Payer: Medicare Other | Admitting: Neurology

## 2016-09-02 NOTE — Progress Notes (Signed)
  Shelley Barnes was seen today in the movement disorders clinic for neurologic consultation.  Her PCP is [name], Michael J, MD.   The patient is seen today as a second opinion from Dr. Willis.  I have reviewed Dr. Willis's records.  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  The patient was first seen by Dr. Willis in October, 2016 with a complaint of left-sided weakness.  Pt states that sx's started about one year ago.  She states that she noted difficulty getting in and out of a chair and difficulty bending over.  She agrees that she has L sided "weakness" and had trouble getting in and out of exam shorts today because she had trouble lifting the L leg.    The patient was started on levodopa by Dr. Willis, but was not able to tolerate more than carbidopa/levodopa 25/100, 1 tablet 4 times per day.   Pt states that her husband told her that the medication helped but the patient was not convinced.   Dosages higher than this caused nausea (tried 2 tablets at a time).  She admits that she has dropped the medication to one at night only for "jittery legs."   She refused DaT scanning per the records.    The patient had an MRI of the brain with and without gadolinium on 12/18/2014.  There was evidence of moderate atrophy and white matter disease.  I don't have films for my review.     07/26/15 update:  The patient follows up today.  This patient is accompanied in the office by her spouse who supplements the history.  She went back to Dr. Willis since our last visit and had an EMG.  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  This was reported to show mildly reduced activation in all leg muscles, consistent with either poor effort or upper motor neuron lesion.  She has previously had an MRI of the brain in August, 2016 that apparently showed atrophy and moderate white matter disease.  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  There was a disc protrusion at the C6-C7 level but no cord deformity.  I do not have those films, but only the report.  She subsequently saw neurosurgery on 07/23/2015.  I reviewed those notes, although I am not sure if they are complete.  There was no plan listed, only a diagnosis.  They tell me today that the neurosurgeon told them that they were told that there was no myelopathy as the cause of the sx's.  They express that neurosurgeon was concerned that the "tongue was vibrating."  Husband and patient states that she is the same since last visit.  No falls.  No swallowing.    08/22/15 update:  The patient follows up today, accompanied by her husband who supplements the history.  Since our last visit, she did have a DaT scan.  The study was limited by motion, but it was felt to be interpretable.  There was asymmetric uptake, grade 1, with normal uptake on the left and decreased uptake in the right putamen.  This does correlate with her symptoms of decreased coordination on the left.  11/02/15 update:  The patient follows up today, accompanied by her husband who supplements the history.    Since our last visit, she got started on low dose levodopa and has worked to 1 po tid.  She had SE in past when at 2 po qid but she didn't titrate upward very slow.  She did PT/OT since our last visit.  She has completed OT but is still doing PT.  OT helped with strength.  PT she hasn't found helpful.   Had one fall getting out of beauty shop chair and tripped over the bar that raises the chair and fell back.  Slipped on tile in [place].  Did not get hurt with either of these.  Didn't have walker with either.    No hallucinations.  No syncope.  Went to symposium in [place] and learned a lot.  Husband states coughing a lot with eating.  Also hip  pain.  Xray neg.    01/08/16 update:  The patient follows up today, accompanied by her husband who supplements the history.  Her carbidopa/levodopa 25/100 was increased last visit so that she is not taking 2 tablets 3 times per day.  She initially had trouble with this titration because of nausea and vomiting when she got up to the 2 tablets 3 times per day, so she was told to try taking one tablet 6 times per day.  She was able to get up to carbidopa/levodopa 25/100, 2 po tid.  One fall in the laundry room after slipping.  She had a swallow study on 11/14/2015 and this demonstrated mild pharyngeal phase dysphagia.  No treatment recommendations were made or felt necessary.  She was given swallowing precautions.  She does tell me that a few days ago she was eating salad and began to choke.  Lasted few min.   She dropped off her living will that our office since last visit.  It said that she could have hydration and mentioned to her power of attorney was for healthcare.  It did say that life-prolonging measures could be withheld or withdrawn if she was unable to make decisions.  I clarified this with her and she wanted no CPR with true DNR but husband felt uncomfortable and they decided to talk it over more and let me know in future.  She saw Dr. Groat a few weeks and told that everything looks okay.  No diplopia.   Having L leg pain.  Trouble in the hip and cramping.  Worse when trying to get in car and has to flex hip.  Saw Dr. Olin and given a shot that last a few days.  Feels like a "muscle spasm" when gets in bed.  Has appt with Dr. Olin next week.  Worried because has to fly to funeral this weekend.  Cannot take NSAIDs (states that she was told that after colonoscopy).  Requests something for the pain.    05/05/16 update:  The patient follows up today, accompanied by her husband who supplements the history.  On carbidopa/levodopa 25/100, 2 tablets 3 times per day.  Has some trouble remembering last dose but "my  legs remind me that I forgot."  Reviewed records available to me since last visit.  She fell Thanksgiving night.  Fell backward.  She was evaluated about one week later.  She had x-rays of the thoracic spine which did not reveal any acute fracture, but there was 20% compression of L1, which was new compared to 2016.  Some lightheadedness when first gets up but resolves quickly.    No choking episodes but is coughing with liquids and some   with saliva per husband.  States that she was in PT but was released about 2 weeks ago.  Back feels much better and back to normal.  Doing therapy exercises at home.  Mood has been fair.    09/04/16 update: Patient seen today in follow-up, accompanied by her husband who supplements the history.  I have reviewed multiple records since our last visit.  She was in the emergency room on 08/05/2016.  She had fallen and fx her distal radius.  She had sx on 08/12/16.  She has not fully recovered; she was told 6-8 weeks for it to heal.  She is using percocet, 1 per day for the pain.  She is still on carbidopa/levodopa 25/100, 2 tablets 3 times per day.  After she fell and fractured her wrist, I told the patient's husband when he emailed that I thought it was best that she stopped walking and uses a wheelchair at all times given the risk for falls and fractures.   She isn't doing that but husband states that she didn't have the walker when she fell so now she is using the walker at all times.  Reports that she had one other fall right after I saw her in January and she broke her finger.   She has not had any hallucinations.  No lightheadedness or near syncope.  No choking.   ALLERGIES:   Allergies  Allergen Reactions  . Azithromycin Nausea And Vomiting  . Clindamycin/Lincomycin Nausea And Vomiting    CURRENT MEDICATIONS:  Outpatient Encounter Prescriptions as of 09/04/2016  Medication Sig  . aspirin 81 MG tablet Take 81 mg by mouth daily.  . carbidopa-levodopa (SINEMET IR)  25-100 MG tablet 3 in the morning, 2 in the afternoon, 2 in the evening  . cetirizine (ZYRTEC) 10 MG tablet Take 10 mg by mouth daily.  . cholecalciferol (VITAMIN D) 1000 units tablet Take 5,000 Units by mouth daily.  . Cyanocobalamin (VITAMIN B-12) 2500 MCG SUBL Place under the tongue.  . furosemide (LASIX) 40 MG tablet Take 40 mg by mouth.  . levothyroxine (SYNTHROID, LEVOTHROID) 75 MCG tablet Take 75 mcg by mouth daily before breakfast.  . omeprazole (PRILOSEC) 20 MG capsule Take 20 mg by mouth daily.  . simvastatin (ZOCOR) 10 MG tablet Take 10 mg by mouth daily.   No facility-administered encounter medications on file as of 09/04/2016.     PAST MEDICAL HISTORY:   Past Medical History:  Diagnosis Date  . Hyperlipidemia   . Hypertension   . Hypothyroidism   . Parkinson's disease (HCC)     PAST SURGICAL HISTORY:   Past Surgical History:  Procedure Laterality Date  . ABDOMINAL HYSTERECTOMY    . APPENDECTOMY    . IR GENERIC HISTORICAL  04/08/2016   IR RADIOLOGIST EVAL & MGMT 04/08/2016 Gilmer Mor, DO [facility] INTERV RAD    SOCIAL HISTORY:   Social History   Social History  . Marital status: Married    Spouse name: N/A  . Number of children: N/A  . Years of education: N/A   Occupational History  . retired     Engineer, site; Research officer, political party; Print production planner   Social History Main Topics  . Smoking status: Never Smoker  . Smokeless tobacco: Not on file  . Alcohol use No  . Drug use: No  . Sexual activity: Not on file   Other Topics Concern  . Not on file   Social History Narrative  . No narrative on file  FAMILY HISTORY:   Family Status  Relation Status  . Mother Deceased       HTN; unknown cause of death  . Father Deceased       Angina, DM  . Sister Alive       2, both with DM  . Brother Alive       DM  . Child Alive       2, adopted, healthy    ROS:  A complete 10 system review of systems was obtained and was unremarkable apart from what is mentioned  above.  PHYSICAL EXAMINATION:    VITALS:   Vitals:   09/04/16 1054  BP: 112/70  Pulse: 86  SpO2: 94%  Height: 5' (1.524 m)   Pt refused to weigh  GEN:  The patient appears stated age and is in NAD. HEENT:  Normocephalic, atraumatic.  The mucous membranes are moist. The superficial temporal arteries are without ropiness or tenderness.  No tongue fasciculations CV:  RRR Lungs:  CTAB Neck/HEME:  There are no carotid bruits bilaterally.  Neurological examination:  Orientation: The patient is alert and oriented x3.  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    Pt now with some downgaze paresis.  She is also somewhat apraxic when asked to perform EOM testing.  There were a few square wave jerks.   The visual fields are full to confrontational testing. The speech is fluent and clear. Minimal trouble with gutteral sounds.  She is mildly hypophonic.  Soft palate rises symmetrically and there is no tongue deviation. Hearing is intact to conversational tone. Sensation: Sensation is intact to light touch throughout Motor: Strength is 5/5 in the bilateral upper and lower extremities.   Shoulder shrug is equal and symmetric.  There is no pronator drift.  No fasciculations noted across the chest, back, arms or legs.   Movement examination: Tone: There is good tone today Abnormal movements: no tremor noted.. Coordination:  Pt does have slowness of all forms of RAMs on the L.   She cannot do a lot with L arm because of fracture and now pain.  She does take it out of brace to show me. Gait and Station: The patient uses the walker.  She drags the L leg.  Pivots around the L leg when turns.  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  Clinically is a little better on the medication than off of it.  -brought in living will and is not DNR but may withhold lifeprolonging measures if she is unable to make decisions.    -asked husband to walk around home looking  straight forward and do safety eval as patient starting to have restriction of downgaze  -next derm appt in august 2.  dysphagia  -She had a swallow study on 11/14/2015 and this demonstrated mild pharyngeal phase dysphagia.  No treatment recommendations were made or felt necessary.   3.  L hip pain  -doing better 4.  Compression fx, lumbar  -pain was better but worse again now after recent fall.  Just had MRI lumbar spine on 09/01/16 and reviewed report with her.  Was nonacute for fx and will f/u with ordering physician 5.  Follow up is anticipated in the next 6 months, sooner should new neurologic issues arise.  Much greater than 50% of this visit was spent in counseling and coordinating care.  Total face to face time:  35 min

## 2016-09-04 ENCOUNTER — Encounter: Payer: Self-pay | Admitting: Neurology

## 2016-09-04 ENCOUNTER — Ambulatory Visit (INDEPENDENT_AMBULATORY_CARE_PROVIDER_SITE_OTHER): Payer: Medicare Other | Admitting: Neurology

## 2016-09-04 VITALS — BP 112/70 | HR 86 | Ht 60.0 in

## 2016-09-04 DIAGNOSIS — G231 Progressive supranuclear ophthalmoplegia [Steele-Richardson-Olszewski]: Secondary | ICD-10-CM | POA: Diagnosis not present

## 2016-09-07 ENCOUNTER — Encounter: Payer: Self-pay | Admitting: Neurology

## 2016-12-04 ENCOUNTER — Other Ambulatory Visit: Payer: Self-pay | Admitting: Neurology

## 2016-12-24 ENCOUNTER — Other Ambulatory Visit: Payer: Self-pay | Admitting: Neurology

## 2016-12-24 MED ORDER — CARBIDOPA-LEVODOPA 25-100 MG PO TABS: ORAL_TABLET | ORAL | 0 refills | Status: DC

## 2017-02-11 ENCOUNTER — Encounter: Payer: Self-pay | Admitting: Neurology

## 2017-03-06 NOTE — Progress Notes (Signed)
  Shelley Barnes was seen today in the movement disorders clinic for neurologic consultation.  Her PCP is [name], Michael J, MD.   The patient is seen today as a second opinion from Dr. Willis.  I have reviewed Dr. Willis's records.  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  The patient was first seen by Dr. Willis in October, 2016 with a complaint of left-sided weakness.  Pt states that sx's started about one year ago.  She states that she noted difficulty getting in and out of a chair and difficulty bending over.  She agrees that she has L sided "weakness" and had trouble getting in and out of exam shorts today because she had trouble lifting the L leg.    The patient was started on levodopa by Dr. Willis, but was not able to tolerate more than carbidopa/levodopa 25/100, 1 tablet 4 times per day.   Pt states that her husband told her that the medication helped but the patient was not convinced.   Dosages higher than this caused nausea (tried 2 tablets at a time).  She admits that she has dropped the medication to one at night only for "jittery legs."   She refused DaT scanning per the records.    The patient had an MRI of the brain with and without gadolinium on 12/18/2014.  There was evidence of moderate atrophy and white matter disease.  I don't have films for my review.     07/26/15 update:  The patient follows up today.  This patient is accompanied in the office by her spouse who supplements the history.  She went back to Dr. Willis since our last visit and had an EMG.  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  This was reported to show mildly reduced activation in all leg muscles, consistent with either poor effort or upper motor neuron lesion.  She has previously had an MRI of the brain in August, 2016 that apparently showed atrophy and moderate white matter disease.  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  There was a disc protrusion at the C6-C7 level but no cord deformity.  I do not have those films, but only the report.  She subsequently saw neurosurgery on 07/23/2015.  I reviewed those notes, although I am not sure if they are complete.  There was no plan listed, only a diagnosis.  They tell me today that the neurosurgeon told them that they were told that there was no myelopathy as the cause of the sx's.  They express that neurosurgeon was concerned that the "tongue was vibrating."  Husband and patient states that she is the same since last visit.  No falls.  No swallowing.    08/22/15 update:  The patient follows up today, accompanied by her husband who supplements the history.  Since our last visit, she did have a DaT scan.  The study was limited by motion, but it was felt to be interpretable.  There was asymmetric uptake, grade 1, with normal uptake on the left and decreased uptake in the right putamen.  This does correlate with her symptoms of decreased coordination on the left.  11/02/15 update:  The patient follows up today, accompanied by her husband who supplements the history.    Since our last visit, she got started on low dose levodopa and has worked to 1 po tid.  She had SE in past when at 2 po qid but she didn't titrate upward very slow.  She did PT/OT since our last visit.  She has completed OT but is still doing PT.  OT helped with strength.  PT she hasn't found helpful.   Had one fall getting out of beauty shop chair and tripped over the bar that raises the chair and fell back.  Slipped on tile in [place].  Did not get hurt with either of these.  Didn't have walker with either.    No hallucinations.  No syncope.  Went to symposium in [place] and learned a lot.  Husband states coughing a lot with eating.  Also hip  pain.  Xray neg.    01/08/16 update:  The patient follows up today, accompanied by her husband who supplements the history.  Her carbidopa/levodopa 25/100 was increased last visit so that she is not taking 2 tablets 3 times per day.  She initially had trouble with this titration because of nausea and vomiting when she got up to the 2 tablets 3 times per day, so she was told to try taking one tablet 6 times per day.  She was able to get up to carbidopa/levodopa 25/100, 2 po tid.  One fall in the laundry room after slipping.  She had a swallow study on 11/14/2015 and this demonstrated mild pharyngeal phase dysphagia.  No treatment recommendations were made or felt necessary.  She was given swallowing precautions.  She does tell me that a few days ago she was eating salad and began to choke.  Lasted few min.   She dropped off her living will that our office since last visit.  It said that she could have hydration and mentioned to her power of attorney was for healthcare.  It did say that life-prolonging measures could be withheld or withdrawn if she was unable to make decisions.  I clarified this with her and she wanted no CPR with true DNR but husband felt uncomfortable and they decided to talk it over more and let me know in future.  She saw Dr. Groat a few weeks and told that everything looks okay.  No diplopia.   Having L leg pain.  Trouble in the hip and cramping.  Worse when trying to get in car and has to flex hip.  Saw Dr. Olin and given a shot that last a few days.  Feels like a "muscle spasm" when gets in bed.  Has appt with Dr. Olin next week.  Worried because has to fly to funeral this weekend.  Cannot take NSAIDs (states that she was told that after colonoscopy).  Requests something for the pain.    05/05/16 update:  The patient follows up today, accompanied by her husband who supplements the history.  On carbidopa/levodopa 25/100, 2 tablets 3 times per day.  Has some trouble remembering last dose but "my  legs remind me that I forgot."  Reviewed records available to me since last visit.  She fell Thanksgiving night.  Fell backward.  She was evaluated about one week later.  She had x-rays of the thoracic spine which did not reveal any acute fracture, but there was 20% compression of L1, which was new compared to 2016.  Some lightheadedness when first gets up but resolves quickly.    No choking episodes but is coughing with liquids and some   with saliva per husband.  States that she was in PT but was released about 2 weeks ago.  Back feels much better and back to normal.  Doing therapy exercises at home.  Mood has been fair.    09/04/16 update: Patient seen today in follow-up, accompanied by her husband who supplements the history.  I have reviewed multiple records since our last visit.  She was in the emergency room on 08/05/2016.  She had fallen and fx her distal radius.  She had sx on 08/12/16.  She has not fully recovered; she was told 6-8 weeks for it to heal.  She is using percocet, 1 per day for the pain.  She is still on carbidopa/levodopa 25/100, 2 tablets 3 times per day.  After she fell and fractured her wrist, I told the patient's husband when he emailed that I thought it was best that she stopped walking and uses a wheelchair at all times given the risk for falls and fractures.   She isn't doing that but husband states that she didn't have the walker when she fell so now she is using the walker at all times.  Reports that she had one other fall right after I saw her in January and she broke her finger.   She has not had any hallucinations.  No lightheadedness or near syncope.  No choking.  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  She is accompanied by her husband who supplements the history.  Patient is on carbidopa/levodopa 25/100, 3/2/2.  They did email me since last visit with coughing, especially with liquids.   I recommended a swallow study.  She had not had  one since July, 2017.  They declined and wanted to wait until we talked about it today.  Husband states that the coughing is every day to some extent but usually with saliva.  Eye doctor suggested cataract surgery.  She is not sure if she is going to schedule it but may early in the new year.  She sees Dr. Groat.  She has had 3-4 falls, especially when putting on pants.  No syncope.     ALLERGIES:   Allergies  Allergen Reactions  . Azithromycin Nausea And Vomiting  . Clindamycin/Lincomycin Nausea And Vomiting    CURRENT MEDICATIONS:  Outpatient Encounter Medications as of 03/09/2017  Medication Sig  . aspirin 81 MG tablet Take 81 mg by mouth daily.  . carbidopa-levodopa (SINEMET IR) 25-100 MG tablet TAKE 3 TABLETS BY MOUTH IN THE MORNING, 2 IN THE AFTERNOON, AND 2 IN THE EVENING  . cetirizine (ZYRTEC) 10 MG tablet Take 10 mg by mouth daily.  . cholecalciferol (VITAMIN D) 1000 units tablet Take 5,000 Units by mouth daily.  . Cyanocobalamin (VITAMIN B-12) 2500 MCG SUBL Place under the tongue.  . furosemide (LASIX) 40 MG tablet Take 40 mg by mouth.  . levothyroxine (SYNTHROID, LEVOTHROID) 75 MCG tablet Take 75 mcg by mouth daily before breakfast.  . omeprazole (PRILOSEC) 20 MG capsule Take 20 mg by mouth daily.  . simvastatin (ZOCOR) 10 MG tablet Take 10 mg by mouth daily.   No facility-administered encounter medications on file as of 03/09/2017.     PAST MEDICAL HISTORY:   Past Medical History:  Diagnosis Date  . Hyperlipidemia   . Hypertension   . Hypothyroidism   . Parkinson's disease (HCC)     PAST SURGICAL HISTORY:   Past Surgical History:  Procedure Laterality Date  . ABDOMINAL HYSTERECTOMY    .   APPENDECTOMY    . IR GENERIC HISTORICAL  04/08/2016   IR RADIOLOGIST EVAL & MGMT 04/08/2016 Jaime Wagner, DO [facility] INTERV RAD    SOCIAL HISTORY:   Social History   Socioeconomic History  . Marital status: Married    Spouse name: Not on file  . Number of children: Not on  file  . Years of education: Not on file  . Highest education level: Not on file  Social Needs  . Financial resource strain: Not on file  . Food insecurity - worry: Not on file  . Food insecurity - inability: Not on file  . Transportation needs - medical: Not on file  . Transportation needs - non-medical: Not on file  Occupational History  . Occupation: retired    Comment: school teacher; real estate; office manager  Tobacco Use  . Smoking status: Never Smoker  . Smokeless tobacco: Never Used  Substance and Sexual Activity  . Alcohol use: No    Alcohol/week: 0.0 oz  . Drug use: No  . Sexual activity: Not on file  Other Topics Concern  . Not on file  Social History Narrative  . Not on file    FAMILY HISTORY:   Family Status  Relation Name Status  . Mother  Deceased       HTN; unknown cause of death  . Father  Deceased       Angina, DM  . Sister  Alive       2, both with DM  . Brother  Alive       DM  . Child  Alive       2, adopted, healthy    ROS:  A complete 10 system review of systems was obtained and was unremarkable apart from what is mentioned above.  PHYSICAL EXAMINATION:    VITALS:   There were no vitals filed for this visit. Pt refused to weigh  GEN:  The patient appears stated age and is in NAD. HEENT:  Normocephalic, atraumatic.  The mucous membranes are moist. The superficial temporal arteries are without ropiness or tenderness.  No tongue fasciculations CV:  RRR Lungs:  CTAB Neck/HEME:  There are no carotid bruits bilaterally.  Neurological examination:  Orientation: The patient is alert and oriented x3.  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    Pt now with some downgaze paresis.  She is also somewhat apraxic when asked to perform EOM testing. She turns her entire head to talk to the examiner rather than just moving the eyes even though they can move horizontally.   There were a few square wave jerks.   The visual fields are full to  confrontational testing. The speech is fluent and clear. Minimal trouble with gutteral sounds.  She is mildly hypophonic.  Soft palate rises symmetrically and there is no tongue deviation. Hearing is intact to conversational tone. Sensation: Sensation is intact to light touch throughout Motor: Strength is 5/5 in the bilateral upper and lower extremities.   Shoulder shrug is equal and symmetric.  There is no pronator drift.  No fasciculations noted across the chest, back, arms or legs.   Movement examination: Tone: There is good tone today Abnormal movements: no tremor noted.. Coordination:  Pt does have slowness with any form of RAMS, including alternating supination and pronation of the forearm, hand opening and closing, finger taps, heel taps and toe taps but the L is much more significant than the right. Gait and Station: The patient uses the walker.  She   drags the L leg.  Pivots around the L leg when turns.  ASSESSMENT/PLAN:  1.  PSP  -increase carbidopa/levodopa 25/100 to 3/2/2.  Clinically is a little better on the medication than off of it.  -Talked to the patient and her husband about the [facility] organization.  Talked about what to expect now as well as in the future.  -Talked about the importance of not just sitting around and watching TV.  Talked about increasing socialization, safely.  -Met with our social worker today.  She talked to them about starting an atypical support group.  She will keep in touch with them. 2.  dysphagia  -She had a swallow study on 11/14/2015 and this demonstrated mild pharyngeal phase dysphagia.  She has had more coughing with saliva.  I recommended a repeat swallow study.  They were agreeable. 3.  I will plan on seeing them back in the next 4-5 months, sooner should new neurologic issues arise.  Greater than 50% of the 30-minute visit was spent in counseling with the patient and her husband. 

## 2017-03-09 ENCOUNTER — Telehealth: Payer: Self-pay | Admitting: Neurology

## 2017-03-09 ENCOUNTER — Encounter: Payer: Self-pay | Admitting: Neurology

## 2017-03-09 ENCOUNTER — Encounter: Payer: Self-pay | Admitting: Psychology

## 2017-03-09 ENCOUNTER — Ambulatory Visit (INDEPENDENT_AMBULATORY_CARE_PROVIDER_SITE_OTHER): Payer: Medicare Other | Admitting: Neurology

## 2017-03-09 VITALS — BP 140/80 | HR 80 | Ht 60.0 in

## 2017-03-09 DIAGNOSIS — R1310 Dysphagia, unspecified: Secondary | ICD-10-CM

## 2017-03-09 DIAGNOSIS — G231 Progressive supranuclear ophthalmoplegia [Steele-Richardson-Olszewski]: Secondary | ICD-10-CM | POA: Diagnosis not present

## 2017-03-09 NOTE — Progress Notes (Signed)
Mr. and Mrs. Shelley Barnes would like information when Atypical Parkinson's support group starts. Will send them information on services from [employer] [facility] and any relevant statewide research if there is any information available at this time. Patient has completed advanced directives in the past.

## 2017-03-09 NOTE — Telephone Encounter (Signed)
Mychart message sent to patient.  Making her aware of [facility]BE appt.

## 2017-03-11 ENCOUNTER — Other Ambulatory Visit (HOSPITAL_COMMUNITY): Payer: Self-pay | Admitting: Neurology

## 2017-03-11 DIAGNOSIS — R1310 Dysphagia, unspecified: Secondary | ICD-10-CM

## 2017-03-24 ENCOUNTER — Ambulatory Visit (HOSPITAL_COMMUNITY): Payer: Medicare Other

## 2017-03-27 ENCOUNTER — Encounter: Payer: Self-pay | Admitting: Neurology

## 2017-03-30 ENCOUNTER — Ambulatory Visit (HOSPITAL_COMMUNITY)
Admission: RE | Admit: 2017-03-30 | Discharge: 2017-03-30 | Disposition: A | Discharge status: Home / Self Care | Payer: Medicare Other | Source: Ambulatory Visit | Attending: Neurology | Admitting: Neurology

## 2017-03-30 DIAGNOSIS — R1310 Dysphagia, unspecified: Secondary | ICD-10-CM

## 2017-03-30 NOTE — Progress Notes (Signed)
Modified Barium Swallow Progress Note  Patient Details  Name: Shelley Barnes MRN: [id number] Date of Birth: [birth year]  Today's Date: 03/30/2017  Modified Barium Swallow completed.  Full report located under Chart Review in the Imaging Section.  Brief recommendations include the following:  Clinical Impression  Oral manipulation/mastication with thin barium and solid texture were WFL's. Pt unable to transit barium pill with thin and additional bites puree likely due to pills non coated texture. Timing of swallow, mobility and strength of laryngeal/pharyngeal musculature were adequate and no aspiration observed during this study. Pt exhibited an immediate and strong cough during transition of pill and thin likely due to incoordination and mild suspicion of penetration with thin barium (not visualized) which can result in higher penetration risk given mixed textures. Recommend pt continue regular textures and thin liquids using caution with mixed consistencies. Pt states she coughs with pills "only in the morning." Suggested one pill at a time with thin or pill whole in applesauce texture. No observable difficulty during esophageal scan (does not diagnose esophageal impairments). Overall, pt's swallow function has not significantly changed from previous assessment 10/2015. Educated pt to take smaller bites (admitted to larger bites), small sips and use diligence with precautions given PSP and Parkinsons.   Swallow Evaluation Recommendations       SLP Diet Recommendations: Regular solids;Thin liquid   Liquid Administration via: Cup(prefers cup over straw)   Medication Administration: Whole meds with liquid(in puree if difficulty with thin)   Supervision: Patient able to self feed   Compensations: Slow rate;Small sips/bites   Postural Changes: Seated upright at 90 degrees   Oral Care Recommendations: Oral care BID        Royce MacadamiaLitaker, Trae Bovenzi Willis 03/30/2017,1:41 PM   Breck CoonsLisa Willis RunvilleLitaker  M.Ed [employer] Pager [phone]

## 2017-04-12 ENCOUNTER — Encounter: Payer: Self-pay | Admitting: Neurology

## 2017-04-16 ENCOUNTER — Encounter: Payer: Self-pay | Admitting: Neurology

## 2017-04-17 MED ORDER — CARBIDOPA-LEVODOPA 25-100 MG PO TABS: ORAL_TABLET | ORAL | 1 refills | Status: DC

## 2017-05-12 ENCOUNTER — Encounter: Payer: Self-pay | Admitting: Neurology

## 2017-06-22 ENCOUNTER — Encounter: Payer: Self-pay | Admitting: Neurology

## 2017-08-05 ENCOUNTER — Other Ambulatory Visit: Payer: Self-pay | Admitting: Neurology

## 2017-09-03 NOTE — Progress Notes (Signed)
  Shelley Barnes was seen today in the movement disorders clinic for neurologic consultation.  Her PCP is [name], Michael J, MD.   The patient is seen today as a second opinion from Dr. Willis.  I have reviewed Dr. Willis's records.  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  The patient was first seen by Dr. Willis in October, 2016 with a complaint of left-sided weakness.  Pt states that sx's started about one year ago.  She states that she noted difficulty getting in and out of a chair and difficulty bending over.  She agrees that she has L sided "weakness" and had trouble getting in and out of exam shorts today because she had trouble lifting the L leg.    The patient was started on levodopa by Dr. Willis, but was not able to tolerate more than carbidopa/levodopa 25/100, 1 tablet 4 times per day.   Pt states that her husband told her that the medication helped but the patient was not convinced.   Dosages higher than this caused nausea (tried 2 tablets at a time).  She admits that she has dropped the medication to one at night only for "jittery legs."   She refused DaT scanning per the records.    The patient had an MRI of the brain with and without gadolinium on 12/18/2014.  There was evidence of moderate atrophy and white matter disease.  I don't have films for my review.     07/26/15 update:  The patient follows up today.  This patient is accompanied in the office by her spouse who supplements the history.  She went back to Dr. Willis since our last visit and had an EMG.  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  This was reported to show mildly reduced activation in all leg muscles, consistent with either poor effort or upper motor neuron lesion.  She has previously had an MRI of the brain in August, 2016 that apparently showed atrophy and moderate white matter disease.  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  There was a disc protrusion at the C6-C7 level but no cord deformity.  I do not have those films, but only the report.  She subsequently saw neurosurgery on 07/23/2015.  I reviewed those notes, although I am not sure if they are complete.  There was no plan listed, only a diagnosis.  They tell me today that the neurosurgeon told them that they were told that there was no myelopathy as the cause of the sx's.  They express that neurosurgeon was concerned that the "tongue was vibrating."  Husband and patient states that she is the same since last visit.  No falls.  No swallowing.    08/22/15 update:  The patient follows up today, accompanied by her husband who supplements the history.  Since our last visit, she did have a DaT scan.  The study was limited by motion, but it was felt to be interpretable.  There was asymmetric uptake, grade 1, with normal uptake on the left and decreased uptake in the right putamen.  This does correlate with her symptoms of decreased coordination on the left.  11/02/15 update:  The patient follows up today, accompanied by her husband who supplements the history.    Since our last visit, she got started on low dose levodopa and has worked to 1 po tid.  She had SE in past when at 2 po qid but she didn't titrate upward very slow.  She did PT/OT since our last visit.  She has completed OT but is still doing PT.  OT helped with strength.  PT she hasn't found helpful.   Had one fall getting out of beauty shop chair and tripped over the bar that raises the chair and fell back.  Slipped on tile in [place].  Did not get hurt with either of these.  Didn't have walker with either.    No hallucinations.  No syncope.  Went to symposium in [place] and learned a lot.  Husband states coughing a lot with eating.  Also hip  pain.  Xray neg.    01/08/16 update:  The patient follows up today, accompanied by her husband who supplements the history.  Her carbidopa/levodopa 25/100 was increased last visit so that she is not taking 2 tablets 3 times per day.  She initially had trouble with this titration because of nausea and vomiting when she got up to the 2 tablets 3 times per day, so she was told to try taking one tablet 6 times per day.  She was able to get up to carbidopa/levodopa 25/100, 2 po tid.  One fall in the laundry room after slipping.  She had a swallow study on 11/14/2015 and this demonstrated mild pharyngeal phase dysphagia.  No treatment recommendations were made or felt necessary.  She was given swallowing precautions.  She does tell me that a few days ago she was eating salad and began to choke.  Lasted few min.   She dropped off her living will that our office since last visit.  It said that she could have hydration and mentioned to her power of attorney was for healthcare.  It did say that life-prolonging measures could be withheld or withdrawn if she was unable to make decisions.  I clarified this with her and she wanted no CPR with true DNR but husband felt uncomfortable and they decided to talk it over more and let me know in future.  She saw Dr. Groat a few weeks and told that everything looks okay.  No diplopia.   Having L leg pain.  Trouble in the hip and cramping.  Worse when trying to get in car and has to flex hip.  Saw Dr. Olin and given a shot that last a few days.  Feels like a "muscle spasm" when gets in bed.  Has appt with Dr. Olin next week.  Worried because has to fly to funeral this weekend.  Cannot take NSAIDs (states that she was told that after colonoscopy).  Requests something for the pain.    05/05/16 update:  The patient follows up today, accompanied by her husband who supplements the history.  On carbidopa/levodopa 25/100, 2 tablets 3 times per day.  Has some trouble remembering last dose but "my  legs remind me that I forgot."  Reviewed records available to me since last visit.  She fell Thanksgiving night.  Fell backward.  She was evaluated about one week later.  She had x-rays of the thoracic spine which did not reveal any acute fracture, but there was 20% compression of L1, which was new compared to 2016.  Some lightheadedness when first gets up but resolves quickly.    No choking episodes but is coughing with liquids and some   with saliva per husband.  States that she was in PT but was released about 2 weeks ago.  Back feels much better and back to normal.  Doing therapy exercises at home.  Mood has been fair.    09/04/16 update: Patient seen today in follow-up, accompanied by her husband who supplements the history.  I have reviewed multiple records since our last visit.  She was in the emergency room on 08/05/2016.  She had fallen and fx her distal radius.  She had sx on 08/12/16.  She has not fully recovered; she was told 6-8 weeks for it to heal.  She is using percocet, 1 per day for the pain.  She is still on carbidopa/levodopa 25/100, 2 tablets 3 times per day.  After she fell and fractured her wrist, I told the patient's husband when he emailed that I thought it was best that she stopped walking and uses a wheelchair at all times given the risk for falls and fractures.   She isn't doing that but husband states that she didn't have the walker when she fell so now she is using the walker at all times.  Reports that she had one other fall right after I saw her in January and she broke her finger.   She has not had any hallucinations.  No lightheadedness or near syncope.  No choking.  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  She is accompanied by her husband who supplements the history.  Patient is on carbidopa/levodopa 25/100, 3/2/2.  They did email me since last visit with coughing, especially with liquids.   I recommended a swallow study.  She had not had  one since July, 2017.  They declined and wanted to wait until we talked about it today.  Husband states that the coughing is every day to some extent but usually with saliva.  Eye doctor suggested cataract surgery.  She is not sure if she is going to schedule it but may early in the new year.  She sees Dr. Dione Booze.  She has had 3-4 falls, especially when putting on pants.  No syncope.    09/03/17 update: Patient is seen today in follow-up.  She is accompanied by her husband who supplements history.  She remains on carbidopa/levodopa 25/100, 3 tablets in the morning, 2 in the afternoon and 2 in the evening.  Modified barium swallow study was repeated since our last visit.  This was done on March 30, 2017.  This was essentially unremarkable and unchanged from 2017.  She was considered to be a mild aspiration risk.  Regular solids with thin liquids recommended. Husband states still has trouble with liquids but not coughing like previous. I received email messages from the patient since our last visit.  The patient fell backwards on February 20, February 21 and June 19, 2017.  On one occasion, she was walking with a walker.  On the other occasion she was either turning or bending over.  Wheelchair was recommended, although I did tell them we could try a merry walker for the short-term.  They did not want a prescription for this.  She has had falls since, the most recent being last week.  She fell in the bathroom.  She had another fall in the kitchen.  She broke the dishwasher door.  I have reviewed records from her primary care physician.  She was there on July 27, 2017 complaining about left hand pain after a fall 10 to  12 days prior.  Wrist x-rays were normal.  She states that "everything" is better.     ALLERGIES:   Allergies  Allergen Reactions  . Azithromycin Nausea And Vomiting  . Clindamycin/Lincomycin Nausea And Vomiting    CURRENT MEDICATIONS:  Outpatient Encounter Medications as of 09/07/2017    Medication Sig  . aspirin 81 MG tablet Take 81 mg by mouth daily.  . carbidopa-levodopa (SINEMET IR) 25-100 MG tablet TAKE 3 TABLETS BY MOUTH EVERY MORNING, 2IN THE AFTERNOON, AND 2 IN THE EVENING  . cetirizine (ZYRTEC) 10 MG tablet Take 10 mg by mouth daily.  . cholecalciferol (VITAMIN D) 1000 units tablet Take 5,000 Units by mouth daily.  . Cyanocobalamin (VITAMIN B-12) 2500 MCG SUBL Place under the tongue.  Marland Kitchen levothyroxine (SYNTHROID, LEVOTHROID) 75 MCG tablet Take 75 mcg by mouth daily before breakfast.  . losartan (COZAAR) 25 MG tablet Take 25 mg by mouth daily.  Marland Kitchen omeprazole (PRILOSEC) 20 MG capsule Take 20 mg by mouth daily.  . simvastatin (ZOCOR) 10 MG tablet Take 10 mg by mouth daily.  . [DISCONTINUED] furosemide (LASIX) 40 MG tablet Take 40 mg by mouth.  . [DISCONTINUED] lisinopril (PRINIVIL,ZESTRIL) 10 MG tablet Take 10 mg daily by mouth.   No facility-administered encounter medications on file as of 09/07/2017.     PAST MEDICAL HISTORY:   Past Medical History:  Diagnosis Date  . Hyperlipidemia   . Hypertension   . Hypothyroidism   . Parkinson's disease (HCC)     PAST SURGICAL HISTORY:   Past Surgical History:  Procedure Laterality Date  . ABDOMINAL HYSTERECTOMY    . APPENDECTOMY    . IR GENERIC HISTORICAL  04/08/2016   IR RADIOLOGIST EVAL & MGMT 04/08/2016 Gilmer Mor, DO [facility] INTERV RAD    SOCIAL HISTORY:   Social History   Socioeconomic History  . Marital status: Married    Spouse name: Not on file  . Number of children: Not on file  . Years of education: Not on file  . Highest education level: Not on file  Occupational History  . Occupation: retired    Comment: Engineer, site; Research officer, political party; Print production planner  Social Needs  . Financial resource strain: Not on file  . Food insecurity:    Worry: Not on file    Inability: Not on file  . Transportation needs:    Medical: Not on file    Non-medical: Not on file  Tobacco Use  . Smoking status: Never  Smoker  . Smokeless tobacco: Never Used  Substance and Sexual Activity  . Alcohol use: No    Alcohol/week: 0.0 oz  . Drug use: No  . Sexual activity: Not on file  Lifestyle  . Physical activity:    Days per week: Not on file    Minutes per session: Not on file  . Stress: Not on file  Relationships  . Social connections:    Talks on phone: Not on file    Gets together: Not on file    Attends religious service: Not on file    Active member of club or organization: Not on file    Attends meetings of clubs or organizations: Not on file    Relationship status: Not on file  . Intimate partner violence:    Fear of current or ex partner: Not on file    Emotionally abused: Not on file    Physically abused: Not on file    Forced sexual activity: Not on file  Other Topics Concern  .  Not on file  Social History Narrative  . Not on file    FAMILY HISTORY:   Family Status  Relation Name Status  . Mother  Deceased       HTN; unknown cause of death  . Father  Deceased       Angina, DM  . Sister  Alive       2, both with DM  . Brother  Alive       DM  . Child  Alive       2, adopted, healthy    ROS:  Review of Systems  Constitutional: Negative.   HENT: Negative.   Eyes: Positive for blurred vision (likely having cataract sx soon) and double vision.  Respiratory: Negative.   Cardiovascular: Negative.   Gastrointestinal: Negative.   Genitourinary: Negative.   Musculoskeletal: Positive for falls.  Skin: Negative.   Neurological: Negative.   Endo/Heme/Allergies: Negative.   Psychiatric/Behavioral: Negative.      PHYSICAL EXAMINATION:    VITALS:   Vitals:   09/07/17 1105  BP: 138/84  Pulse: 88  SpO2: 97%  Height: 5' (1.524 m)   Pt refused to weigh  GEN:  The patient appears stated age and is in NAD. HEENT:  Normocephalic, atraumatic.  The mucous membranes are moist. The superficial temporal arteries are without ropiness or tenderness.  No tongue  fasciculations CV:  RRR Lungs:  CTAB Neck/HEME:  There are no carotid bruits bilaterally.  Neurological examination:  Orientation: The patient is alert and oriented x3.  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    Pt with upgaze and downgaze paresis.  There are square wave jerks.   The visual fields are full to confrontational testing. The speech is fluent and clear. Minimal trouble with gutteral sounds.  She is hypophonic and speech is pseudobulbar.  Soft palate rises symmetrically and there is no tongue deviation. Hearing is intact to conversational tone. Sensation: Sensation is intact to light touch throughout Motor: Strength is 5/5 in the UE/LE except decreased grip on the L compared to the right   Movement examination: Tone: There is good tone today Abnormal movements: no tremor noted.. Coordination: Patient is very slow with all forms of rapid alternating movements on the left compared to that of the right. Gait and Station: The patient uses the walker.  She drags the L leg.  Pivots around the L leg when turns (same as previous visits).  ASSESSMENT/PLAN:  1.  PSP  -Continue carbidopa/levodopa 25/100 to 3/2/2.  Clinically is a little better on the medication than off of it.  -Long discussion with patient and her husband.  I do not think that she should be walking anymore.  We discussed fall risks and morbidity and mortality associated with this.  We discussed various types of transport devices, from transport chairs to regular wheelchairs to motorized wheelchairs.  Her husband would really prefer a scooter.  We talked about the difficulties with potentially mounting a scooter, but he thinks he can help her with that.  He thinks that would be easier to get around within the home, and out of the home he plans to have her use her walker with him walking behind it.  They have to drive to dinner every evening at [facility] and all of the other devices are just too cumbersome for him to  use, including transport chair.  [facility] will look [facility] this with [facility].  The patient is very frustrated by this, but ultimately understood our  reasoning. 2.  dysphagia  -MBE done on March 30, 2017.  This was essentially unremarkable and unchanged from 2017.  She was considered to be a mild aspiration risk.  Regular solids with thin liquids recommended.  3.  Follow-up with me within 6 months, sooner should new neurologic issues arise.  Greater than 50% of the 30-minute visit was spent in counseling, as above.

## 2017-09-07 ENCOUNTER — Ambulatory Visit (INDEPENDENT_AMBULATORY_CARE_PROVIDER_SITE_OTHER): Payer: Medicare Other | Admitting: Neurology

## 2017-09-07 ENCOUNTER — Encounter: Payer: Self-pay | Admitting: Neurology

## 2017-09-07 VITALS — BP 138/84 | HR 88 | Ht 60.0 in

## 2017-09-07 DIAGNOSIS — G231 Progressive supranuclear ophthalmoplegia [Steele-Richardson-Olszewski]: Secondary | ICD-10-CM

## 2017-09-07 NOTE — Patient Instructions (Signed)
Debbie with [facility] will call to let you know about Scooter evaluation.

## 2017-09-09 ENCOUNTER — Telehealth: Payer: Self-pay | Admitting: Neurology

## 2017-09-09 NOTE — Telephone Encounter (Signed)
Community message sent to Zenia Resides with [facility] on 09/07/17 and did not hear back. Faxed scooter evaluation request to Debbie at [phone] with confirmation received. She should reach out to the patient directly.

## 2017-09-14 ENCOUNTER — Encounter: Payer: Self-pay | Admitting: Neurology

## 2017-09-14 ENCOUNTER — Other Ambulatory Visit: Payer: Self-pay | Admitting: Neurology

## 2017-10-12 ENCOUNTER — Encounter: Payer: Self-pay | Admitting: Neurology

## 2017-10-12 NOTE — Telephone Encounter (Signed)
[place].

## 2017-10-31 ENCOUNTER — Encounter: Payer: Self-pay | Admitting: Neurology

## 2017-11-02 MED ORDER — CARBIDOPA-LEVODOPA 25-100 MG PO TABS: ORAL_TABLET | ORAL | 1 refills | Status: DC

## 2017-12-07 IMAGING — RF DG SWALLOWING FUNCTION
1 series · 17 of 24 positions shown · non-contrast
Comparison: No priors.

CLINICAL DATA: 74-year-old female with history of progressive
supranuclear palsy versus Parkinson's disease with dysphagia.

EXAM:
MODIFIED BARIUM SWALLOW
TECHNIQUE: Different consistencies of barium were administered orally to the
patient by the Speech Pathologist. Imaging of the pharynx was
performed in the lateral projection.
FLUOROSCOPY TIME:  Fluoroscopy Time:  34 seconds.

[Series 1: run · 13 acquisitions, 17 frames shown]
[im 1/13]
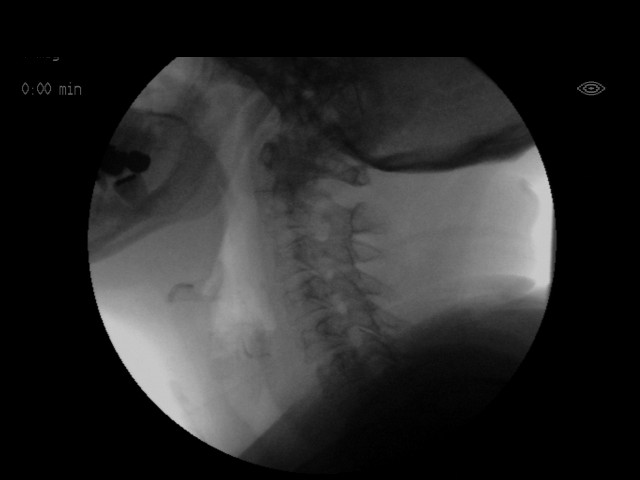
[im 2/13]
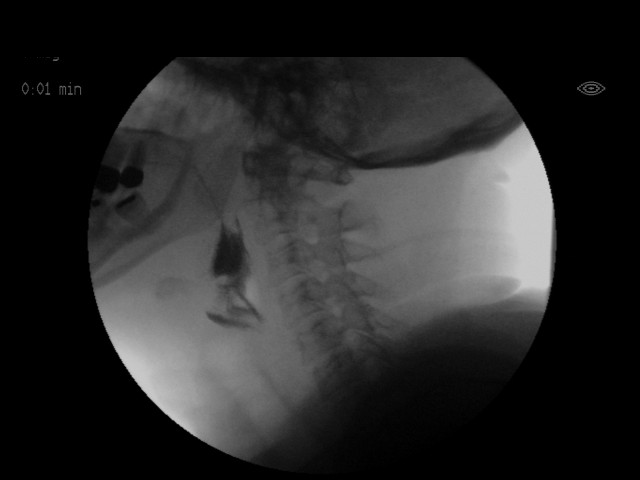
[im 2/13]
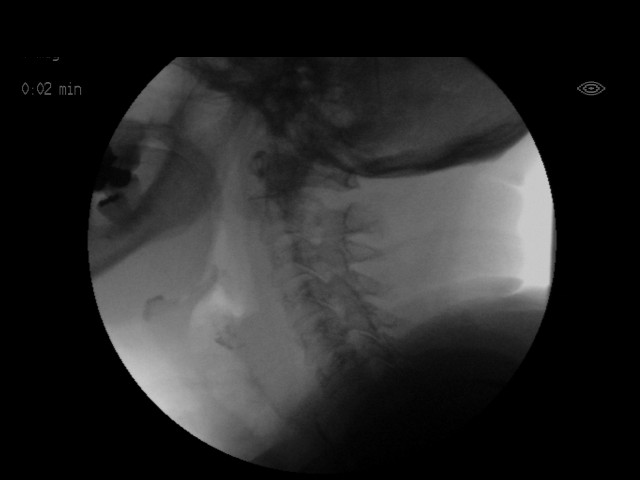
[im 3/13]
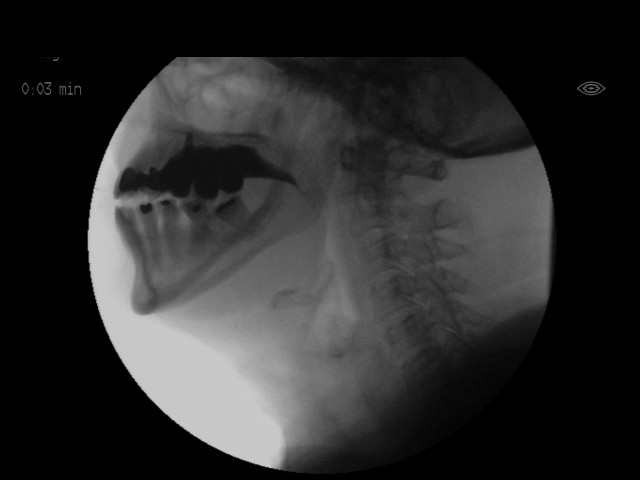
[im 4/13]
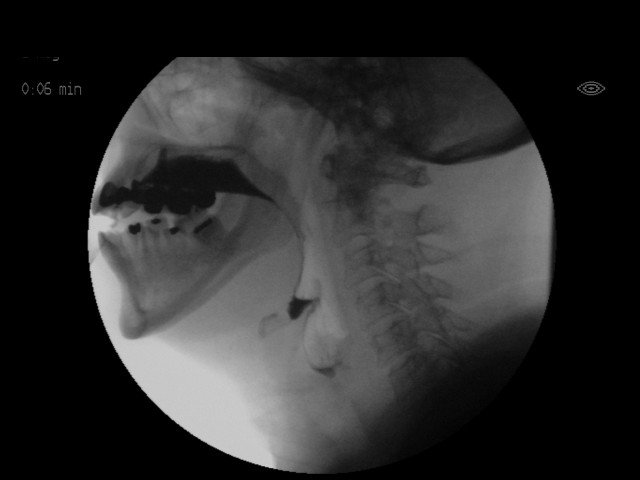
[im 5/13]
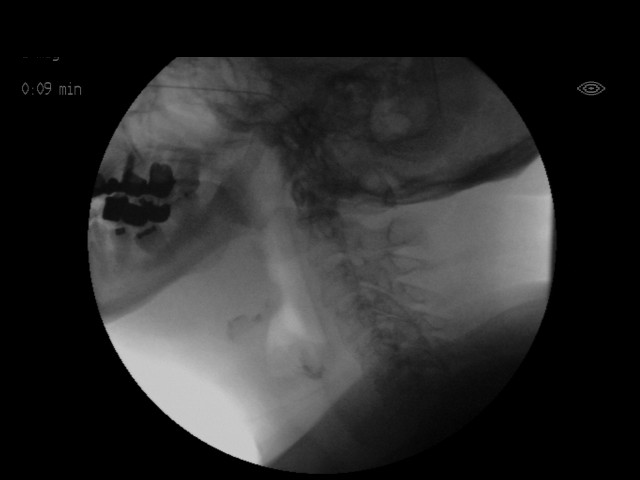
[im 6/13]
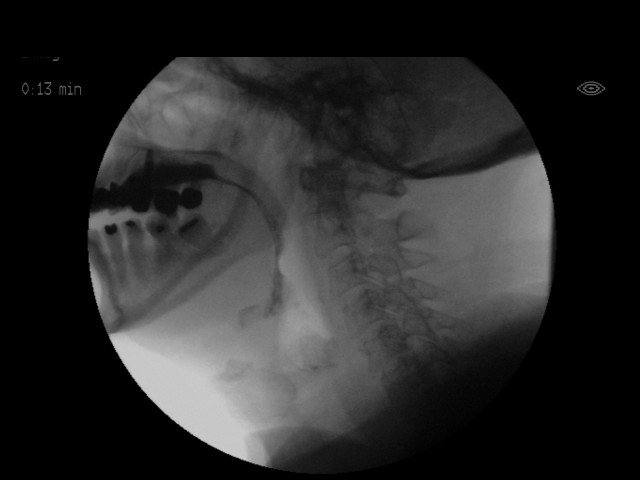
[im 6/13]
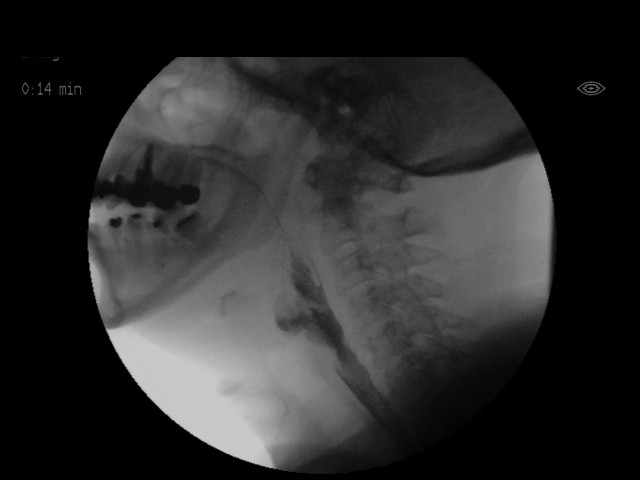
[im 7/13]
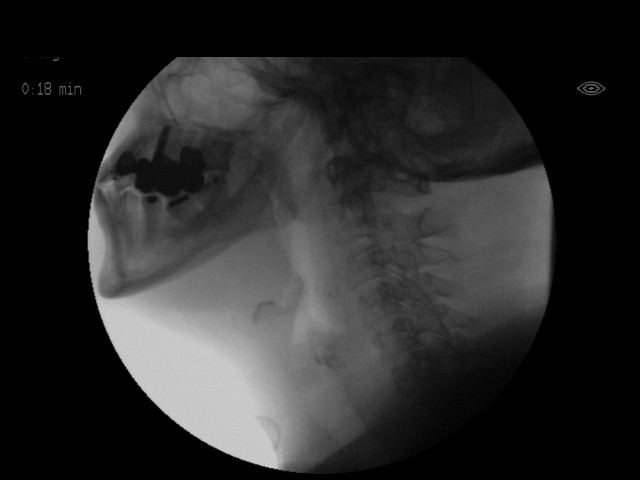
[im 8/13]
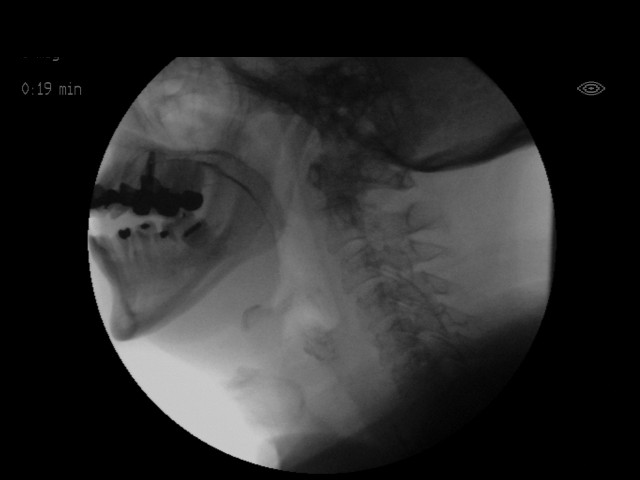
[im 8/13]
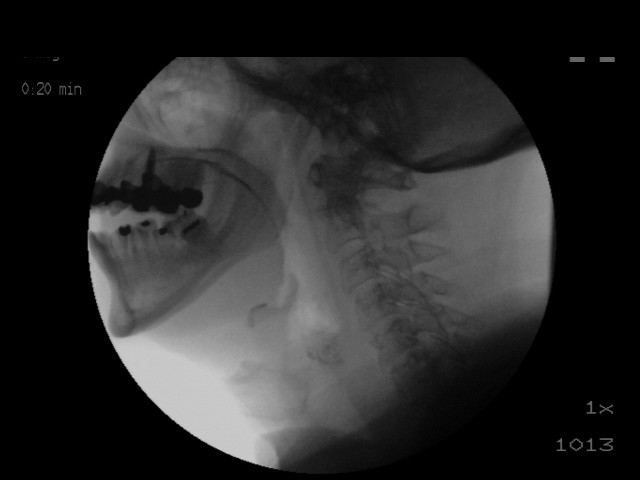
[im 9/13]
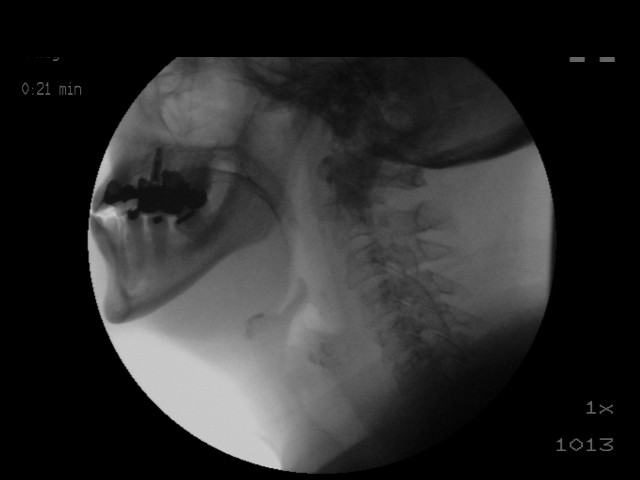
[im 10/13]
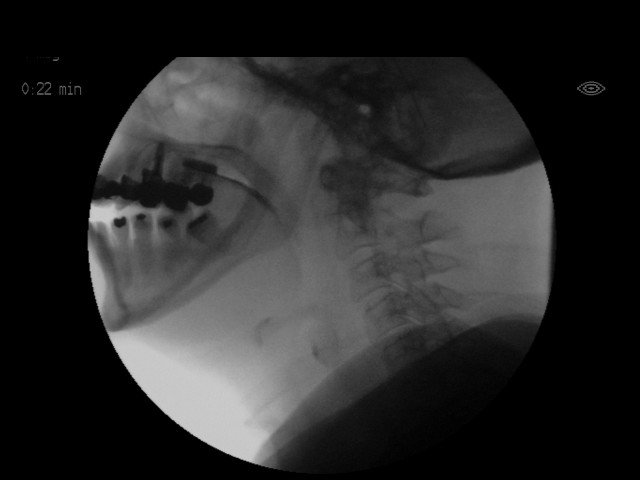
[im 11/13]
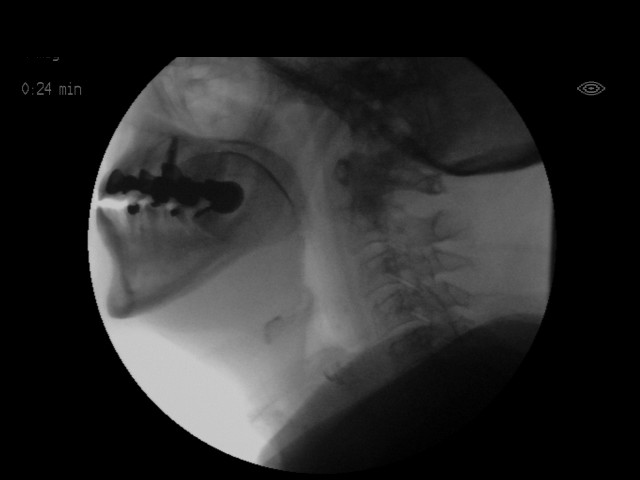
[im 12/13]
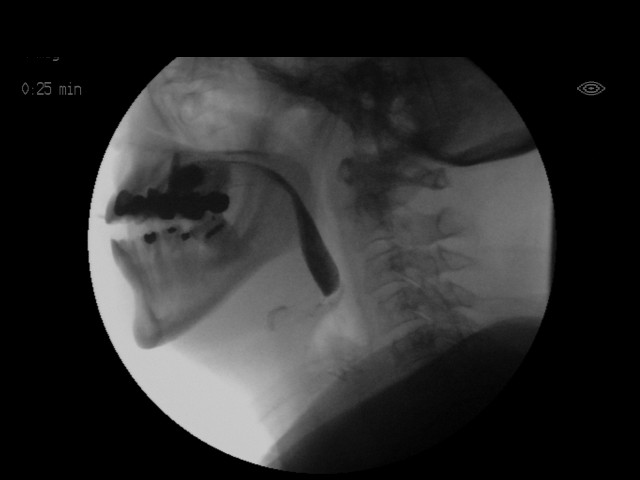
[im 12/13]
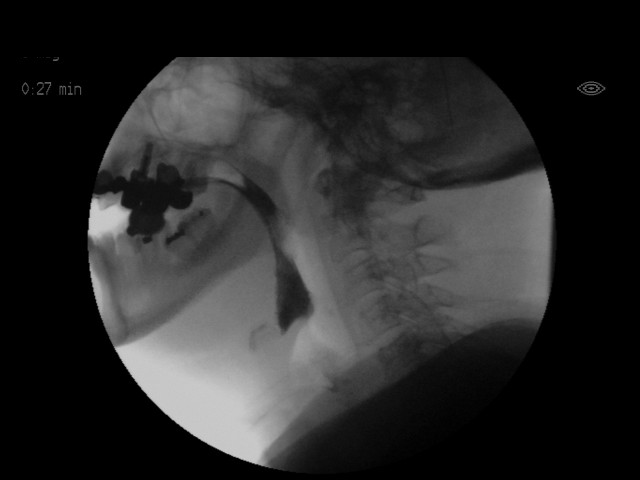
[im 13/13]
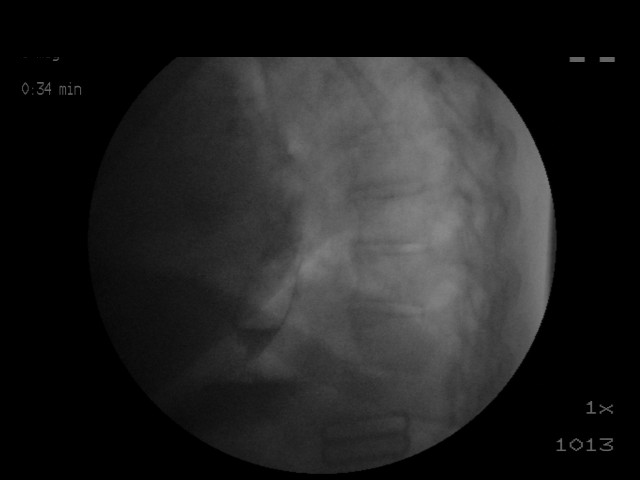

[17 of 24 positions shown; findings below may reference images not displayed]

FINDINGS: Thin liquid- Premature spill over to the vallecular the during straw
sips. Otherwise, normal.

[place] with cracker- Within normal limits

Barium tablet -  Within normal limits
IMPRESSION: 1. Swallow study with findings, as above.

Please refer to the Speech Pathologists report for complete details
and recommendations.

## 2017-12-29 MED ORDER — TRANSPORT CHAIR MISC: 1.0000 | Freq: Every day | 0 refills | Status: DC

## 2018-02-22 LAB — HM DIABETES EYE EXAM

## 2018-03-01 ENCOUNTER — Other Ambulatory Visit (HOSPITAL_COMMUNITY): Payer: Self-pay | Admitting: Nurse Practitioner

## 2018-03-01 DIAGNOSIS — R1310 Dysphagia, unspecified: Secondary | ICD-10-CM

## 2018-03-10 ENCOUNTER — Ambulatory Visit (HOSPITAL_COMMUNITY)
Admission: RE | Admit: 2018-03-10 | Discharge: 2018-03-10 | Disposition: A | Discharge status: Home / Self Care | Payer: Medicare Other | Source: Ambulatory Visit | Attending: Nurse Practitioner | Admitting: Nurse Practitioner

## 2018-03-10 DIAGNOSIS — R1312 Dysphagia, oropharyngeal phase: Secondary | ICD-10-CM | POA: Diagnosis present

## 2018-03-10 DIAGNOSIS — R1310 Dysphagia, unspecified: Secondary | ICD-10-CM

## 2018-03-10 NOTE — Progress Notes (Signed)
  Shelley Barnes was seen today in the movement disorders clinic for neurologic consultation.  Her PCP is [name], Michael J, MD.   The patient is seen today as a second opinion from Dr. Willis.  I have reviewed Dr. Willis's records.  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  The patient was first seen by Dr. Willis in October, 2016 with a complaint of left-sided weakness.  Pt states that sx's started about one year ago.  She states that she noted difficulty getting in and out of a chair and difficulty bending over.  She agrees that she has L sided "weakness" and had trouble getting in and out of exam shorts today because she had trouble lifting the L leg.    The patient was started on levodopa by Dr. Willis, but was not able to tolerate more than carbidopa/levodopa 25/100, 1 tablet 4 times per day.   Pt states that her husband told her that the medication helped but the patient was not convinced.   Dosages higher than this caused nausea (tried 2 tablets at a time).  She admits that she has dropped the medication to one at night only for "jittery legs."   She refused DaT scanning per the records.    The patient had an MRI of the brain with and without gadolinium on 12/18/2014.  There was evidence of moderate atrophy and white matter disease.  I don't have films for my review.     07/26/15 update:  The patient follows up today.  This patient is accompanied in the office by her spouse who supplements the history.  She went back to Dr. Willis since our last visit and had an EMG.  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  This was reported to show mildly reduced activation in all leg muscles, consistent with either poor effort or upper motor neuron lesion.  She has previously had an MRI of the brain in August, 2016 that apparently showed atrophy and moderate white matter disease.  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  There was a disc protrusion at the C6-C7 level but no cord deformity.  I do not have those films, but only the report.  She subsequently saw neurosurgery on 07/23/2015.  I reviewed those notes, although I am not sure if they are complete.  There was no plan listed, only a diagnosis.  They tell me today that the neurosurgeon told them that they were told that there was no myelopathy as the cause of the sx's.  They express that neurosurgeon was concerned that the "tongue was vibrating."  Husband and patient states that she is the same since last visit.  No falls.  No swallowing.    08/22/15 update:  The patient follows up today, accompanied by her husband who supplements the history.  Since our last visit, she did have a DaT scan.  The study was limited by motion, but it was felt to be interpretable.  There was asymmetric uptake, grade 1, with normal uptake on the left and decreased uptake in the right putamen.  This does correlate with her symptoms of decreased coordination on the left.  11/02/15 update:  The patient follows up today, accompanied by her husband who supplements the history.    Since our last visit, she got started on low dose levodopa and has worked to 1 po tid.  She had SE in past when at 2 po qid but she didn't titrate upward very slow.  She did PT/OT since our last visit.  She has completed OT but is still doing PT.  OT helped with strength.  PT she hasn't found helpful.   Had one fall getting out of beauty shop chair and tripped over the bar that raises the chair and fell back.  Slipped on tile in [place].  Did not get hurt with either of these.  Didn't have walker with either.    No hallucinations.  No syncope.  Went to symposium in [place] and learned a lot.  Husband states coughing a lot with eating.  Also hip  pain.  Xray neg.    01/08/16 update:  The patient follows up today, accompanied by her husband who supplements the history.  Her carbidopa/levodopa 25/100 was increased last visit so that she is not taking 2 tablets 3 times per day.  She initially had trouble with this titration because of nausea and vomiting when she got up to the 2 tablets 3 times per day, so she was told to try taking one tablet 6 times per day.  She was able to get up to carbidopa/levodopa 25/100, 2 po tid.  One fall in the laundry room after slipping.  She had a swallow study on 11/14/2015 and this demonstrated mild pharyngeal phase dysphagia.  No treatment recommendations were made or felt necessary.  She was given swallowing precautions.  She does tell me that a few days ago she was eating salad and began to choke.  Lasted few min.   She dropped off her living will that our office since last visit.  It said that she could have hydration and mentioned to her power of attorney was for healthcare.  It did say that life-prolonging measures could be withheld or withdrawn if she was unable to make decisions.  I clarified this with her and she wanted no CPR with true DNR but husband felt uncomfortable and they decided to talk it over more and let me know in future.  She saw Dr. Groat a few weeks and told that everything looks okay.  No diplopia.   Having L leg pain.  Trouble in the hip and cramping.  Worse when trying to get in car and has to flex hip.  Saw Dr. Olin and given a shot that last a few days.  Feels like a "muscle spasm" when gets in bed.  Has appt with Dr. Olin next week.  Worried because has to fly to funeral this weekend.  Cannot take NSAIDs (states that she was told that after colonoscopy).  Requests something for the pain.    05/05/16 update:  The patient follows up today, accompanied by her husband who supplements the history.  On carbidopa/levodopa 25/100, 2 tablets 3 times per day.  Has some trouble remembering last dose but "my  legs remind me that I forgot."  Reviewed records available to me since last visit.  She fell Thanksgiving night.  Fell backward.  She was evaluated about one week later.  She had x-rays of the thoracic spine which did not reveal any acute fracture, but there was 20% compression of L1, which was new compared to 2016.  Some lightheadedness when first gets up but resolves quickly.    No choking episodes but is coughing with liquids and some   with saliva per husband.  States that she was in PT but was released about 2 weeks ago.  Back feels much better and back to normal.  Doing therapy exercises at home.  Mood has been fair.    09/04/16 update: Patient seen today in follow-up, accompanied by her husband who supplements the history.  I have reviewed multiple records since our last visit.  She was in the emergency room on 08/05/2016.  She had fallen and fx her distal radius.  She had sx on 08/12/16.  She has not fully recovered; she was told 6-8 weeks for it to heal.  She is using percocet, 1 per day for the pain.  She is still on carbidopa/levodopa 25/100, 2 tablets 3 times per day.  After she fell and fractured her wrist, I told the patient's husband when he emailed that I thought it was best that she stopped walking and uses a wheelchair at all times given the risk for falls and fractures.   She isn't doing that but husband states that she didn't have the walker when she fell so now she is using the walker at all times.  Reports that she had one other fall right after I saw her in January and she broke her finger.   She has not had any hallucinations.  No lightheadedness or near syncope.  No choking.  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  She is accompanied by her husband who supplements the history.  Patient is on carbidopa/levodopa 25/100, 3/2/2.  They did email me since last visit with coughing, especially with liquids.   I recommended a swallow study.  She had not had  one since July, 2017.  They declined and wanted to wait until we talked about it today.  Husband states that the coughing is every day to some extent but usually with saliva.  Eye doctor suggested cataract surgery.  She is not sure if she is going to schedule it but may early in the new year.  She sees Dr. Dione Booze.  She has had 3-4 falls, especially when putting on pants.  No syncope.    09/03/17 update: Patient is seen today in follow-up.  She is accompanied by her husband who supplements history.  She remains on carbidopa/levodopa 25/100, 3 tablets in the morning, 2 in the afternoon and 2 in the evening.  Modified barium swallow study was repeated since our last visit.  This was done on March 30, 2017.  This was essentially unremarkable and unchanged from 2017.  She was considered to be a mild aspiration risk.  Regular solids with thin liquids recommended. Husband states still has trouble with liquids but not coughing like previous. I received email messages from the patient since our last visit.  The patient fell backwards on February 20, February 21 and June 19, 2017.  On one occasion, she was walking with a walker.  On the other occasion she was either turning or bending over.  Wheelchair was recommended, although I did tell them we could try a merry walker for the short-term.  They did not want a prescription for this.  She has had falls since, the most recent being last week.  She fell in the bathroom.  She had another fall in the kitchen.  She broke the dishwasher door.  I have reviewed records from her primary care physician.  She was there on July 27, 2017 complaining about left hand pain after a fall 10 to  12 days prior.  Wrist x-rays were normal.  She states that "everything" is better.    03/11/18 update: Patient seen today in follow-up, accompanied by her husband and daughter who supplements history.  Patient is supposed to be on carprofen/levodopa 25/100, 3 tablets in the morning, 2 in the  afternoon and 2 in the evening but daughter reports is on 3 in the AM, 4 at bedtime.  Records are reviewed since last visit.  She last saw her nurse practitioner at the end of October.  She was complaining about swallowing troubles.  It appears speech therapy was ordered because of this.   MBE was completed yesterday and demonstrated mild oropharyngeal dysphagia.  Regular diet with thin liquids was recommended.  Chin tuck was recommended.  Last visit, her husband wanted to look into a scooter for getting her to the dining hall.  When the scooter company called them, however, they stated that they were no longer interested.  She decided on a transport chair and hasn't fallen with it.  She has fallen in the shower because she refuses a shower chair.    ALLERGIES:   Allergies  Allergen Reactions  . Azithromycin Nausea And Vomiting  . Clindamycin/Lincomycin Nausea And Vomiting    CURRENT MEDICATIONS:  Outpatient Encounter Medications as of 03/11/2018  Medication Sig  . aspirin 81 MG tablet Take 81 mg by mouth daily.  . carbidopa-levodopa (SINEMET IR) 25-100 MG tablet TAKE 3 TABLETS BY MOUTH EVERY MORNING, 2IN THE AFTERNOON, AND 2 IN THE EVENING (Patient taking differently: TAKE 3 TABLETS BY MOUTH EVERY MORNING, 2IN THE AFTERNOON, AND 2 IN THE EVENING (patient is taking 3am 4hs))  . cetirizine (ZYRTEC) 10 MG tablet Take 10 mg by mouth daily.  . cholecalciferol (VITAMIN D) 1000 units tablet Take 5,000 Units by mouth daily.  . Cyanocobalamin (VITAMIN B-12) 2500 MCG SUBL Place under the tongue.  . furosemide (LASIX) 20 MG tablet Take 20 mg by mouth 3 (three) times a week.  . levothyroxine (SYNTHROID, LEVOTHROID) 75 MCG tablet Take 75 mcg by mouth daily before breakfast.  . losartan (COZAAR) 25 MG tablet Take 25 mg by mouth daily.  Marland Kitchen. omeprazole (PRILOSEC) 20 MG capsule Take 20 mg by mouth daily.  Marland Kitchen. oxybutynin (DITROPAN) 5 MG tablet Take 5 mg by mouth daily.  . simvastatin (ZOCOR) 10 MG tablet Take 10 mg  by mouth daily.  . [DISCONTINUED] Misc. Devices (TRANSPORT CHAIR) MISC 1 Device by Does not apply route daily. DX: G23.1   No facility-administered encounter medications on file as of 03/11/2018.     PAST MEDICAL HISTORY:   Past Medical History:  Diagnosis Date  . Hyperlipidemia   . Hypertension   . Hypothyroidism   . Parkinson's disease (HCC)     PAST SURGICAL HISTORY:   Past Surgical History:  Procedure Laterality Date  . ABDOMINAL HYSTERECTOMY    . APPENDECTOMY    . IR GENERIC HISTORICAL  04/08/2016   IR RADIOLOGIST EVAL & MGMT 04/08/2016 Gilmer MorJaime Wagner, DO [facility] INTERV RAD    SOCIAL HISTORY:   Social History   Socioeconomic History  . Marital status: Married    Spouse name: Not on file  . Number of children: Not on file  . Years of education: Not on file  . Highest education level: Not on file  Occupational History  . Occupation: retired    Comment: Engineer, siteschool teacher; Research officer, political [employer]; Print production planneroffice manager  Social Needs  . Financial resource strain: Not on file  . Food insecurity:  Worry: Not on file    Inability: Not on file  . Transportation needs:    Medical: Not on file    Non-medical: Not on file  Tobacco Use  . Smoking status: Never Smoker  . Smokeless tobacco: Never Used  Substance and Sexual Activity  . Alcohol use: No    Alcohol/week: 0.0 standard drinks  . Drug use: No  . Sexual activity: Not on file  Lifestyle  . Physical activity:    Days per week: Not on file    Minutes per session: Not on file  . Stress: Not on file  Relationships  . Social connections:    Talks on phone: Not on file    Gets together: Not on file    Attends religious service: Not on file    Active member of club or organization: Not on file    Attends meetings of clubs or organizations: Not on file    Relationship status: Not on file  . Intimate partner violence:    Fear of current or ex partner: Not on file    Emotionally abused: Not on file    Physically abused: Not on file      Forced sexual activity: Not on file  Other Topics Concern  . Not on file  Social History Narrative  . Not on file    FAMILY HISTORY:   Family Status  Relation Name Status  . Mother  Deceased       HTN; unknown cause of death  . Father  Deceased       Angina, DM  . Sister  Alive       2, both with DM  . Brother  Alive       DM  . Child  Alive       2, adopted, healthy    ROS:  Review of Systems  Constitutional: Positive for malaise/fatigue.  HENT: Negative.   Eyes: Positive for double vision.  Respiratory: Positive for cough.   Cardiovascular: Negative.   Gastrointestinal: Negative.   Genitourinary: Negative.   Musculoskeletal: Positive for falls.  Skin: Negative.      PHYSICAL EXAMINATION:    VITALS:   Vitals:   03/11/18 1109  BP: 128/78  Pulse: 90  SpO2: 95%   Pt refused to weigh  GEN:  The patient appears stated age and is in NAD. HEENT:  Normocephalic, atraumatic.  The mucous membranes are moist. The superficial temporal arteries are without ropiness or tenderness.  No tongue fasciculations CV:  RRR Lungs:  CTAB Neck/HEME:  There are no carotid bruits bilaterally.  Neurological examination:  Orientation: The patient is alert and oriented x3.  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    Pt with upgaze and downgaze paresis.  There are square wave jerks.   The visual fields are full to confrontational testing. The speech is fluent and dysarthric and bulbar and quality. Minimal trouble with gutteral sounds.   Soft palate rises symmetrically and there is no tongue deviation. Hearing is intact to conversational tone. Sensation: Sensation is intact to light touch throughout Motor: Strength is 5/5 in the UE/LE except decreased grip on the L compared to the right   Movement examination: Tone: There is increased tone in the left upper extremity Abnormal movements: no tremor noted.. Coordination: Patient is very slow with all forms of rapid alternating  movements on the left compared to that of the right. Gait and Station: The patient is in a wheelchair and we did not ambulate  her  ASSESSMENT/PLAN:  1.  PSP  -Continue carbidopa/levodopa 25/100 but discussed how to properly dose.  Should be taking 3 tablets in the morning, 2 in the afternoon and 2 in the evening.    Clinically is a little better on the medication than off of it.  -Long discussion with patient and her family today.  Discussed goals of care.  Her daughter has never been here before and had many questions about the patient, as well as questions about PSP in general.  Answer those to the best of my ability.  Discussed respite care in detail.  -Patient's husband asked about [facility] form.  Gave a copy of the form.  We can fill this out in the future after they have reviewed it. 2.  dysphagia  -MBE done on March 10, 2018.  This was essentially normal.  Chin tuck recommended.  Drinking with straws was recommended. 3.  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  Told him to talk to primary care physician about whether or not she continues to need antihypertensive medication. 4.  Much greater than 50% of this visit was spent in counseling and coordinating care.  Total face to face time:  45 min

## 2018-03-11 ENCOUNTER — Encounter: Payer: Self-pay | Admitting: Neurology

## 2018-03-11 ENCOUNTER — Telehealth: Payer: Self-pay | Admitting: Neurology

## 2018-03-11 ENCOUNTER — Ambulatory Visit (INDEPENDENT_AMBULATORY_CARE_PROVIDER_SITE_OTHER): Payer: Medicare Other | Admitting: Neurology

## 2018-03-11 VITALS — BP 128/78 | HR 90

## 2018-03-11 DIAGNOSIS — R1310 Dysphagia, unspecified: Secondary | ICD-10-CM

## 2018-03-11 DIAGNOSIS — G231 Progressive supranuclear ophthalmoplegia [Steele-Richardson-Olszewski]: Secondary | ICD-10-CM | POA: Diagnosis not present

## 2018-03-11 NOTE — Patient Instructions (Addendum)
   Parkinson's Caregiver Group   Parkinson's disease can be challenging for caregivers too. Changing  abilities and assuming new roles within the family can cause emotional  upheaval. Meeting with a group of peers who are experiencing similar changes is very beneficial for any caregiver. This professionally led support group will provide a safe place for Parkinson's caregivers to connect, share challenges, seek solutions, learn about resources and strengthen coping skills.    When:  Last Monday of the month (unless date falls on a holiday)  2019 Dates: 1/28, 2/25, 3/25, 4/29, 5/20, 6/24, 7/29, 8/26, 9/30, 10/28, 11/25, 12/30   [facility]                                                                                              [address]                                                                                                 [place], [place] [id number]                                                                                      Room 204 Time: 2-3:30   Please call Shelley SnufferJessica Barnes, MSW, LCSW at [phone] with any questions.  You need to use a shower chair! Look into respite care!

## 2018-03-11 NOTE — Telephone Encounter (Signed)
[facility] who states patient had been set up for physical therapy assessment for scooter and cancelled it. When they reached out to her about rescheduling she informed them that she was not interested in proceeding with evaluation/workup for scooter. This was in July 2019.  Dr. Arbutus Leasat Lorain Childes- FYI.

## 2018-03-11 NOTE — Telephone Encounter (Signed)
-----   Message from Octaviano Battyebecca S Tat, DO sent at 03/10/2018  1:07 PM EST ----- See last note.  What happened with scooter?

## 2018-05-04 MED ORDER — CARBIDOPA-LEVODOPA 25-100 MG PO TABS: ORAL_TABLET | ORAL | 1 refills | Status: AC

## 2018-08-30 NOTE — Progress Notes (Signed)
Virtual Visit via Video Note The purpose of this virtual visit is to provide medical care while limiting exposure to the novel coronavirus.    Consent was obtained for video visit:  Yes.   Answered questions that patient had about telehealth interaction:  Yes.   I discussed the limitations, risks, security and privacy concerns of performing an evaluation and management service by telemedicine. I also discussed with the patient that there may be a patient responsible charge related to this service. The patient expressed understanding and agreed to proceed.  Pt location: Home Physician Location: office Name of referring provider:  Javier Glazier, MD I connected with Steward Drone at patients initiation/request on 08/31/2018 at  1:00 PM EDT by video enabled telemedicine application and verified that I am speaking with the correct person using two identifiers. Pt MRN:  [id number] Pt DOB:  [birth year] Video Participants:  Steward Drone;  husband   History of Present Illness:  Patient is seen today in follow-up for PSP.  She is on carbidopa/levodopa 25/100, 3 tablets in the morning, 2 in the afternoon and 2 in the evening.  Last visit we discussed to the [facility] form and they were given a copy of that, but did receive that.    They report today that they have that completed.  They did contact me in February about a choking episode and more shortness of breath, gasping for air, coughing up clear phlegm when she is not choking.  I recommended that she see pulmonary, just to get established.  Her husband emailed me back and stated that he thought that was a good idea, but wanted to talk to the primary care first and would get back to me.  I never heard back after that.  Pt/wife state that they made no decisions regarding pulm.  Pt states that overall she seems to be about the same but husband feels that things have been going downhill.  She generally doesn't walk but husband states that  sometimes if she in a different room she will get up without him and fall.     Observations/Objective:   Vitals:   08/31/18 0933  Weight: 175 lb (79.4 kg)  Height: 5' (1.524 m)   GEN:  The patient appears stated age and is in NAD.  Neurological examination:  Orientation: The patient is alert and oriented x3. Cranial nerves: There is good facial symmetry. There is significant facial hypomimia.  The speech is hypophonic and pseudobulbar.  She has trouble with guttural sounds.  Soft palate rises symmetrically and there is no tongue deviation. Hearing is intact to conversational tone. Motor: Strength is at least antigravity x 4.   Shoulder shrug is equal and symmetric.  There is no pronator drift.  Movement examination: Tone: unable Abnormal movements: None Coordination:  There is very slow with all rapid alternating movements, but much more so in the left hemibody Gait and Station: The patient is in a wheelchair and was not ambulated    Assessment and Plan:   1.  PSP             -Continue carbidopa/levodopa 25/100 but discussed how to properly dose.  Should be taking 3 tablets in the morning, 2 in the afternoon and 2 in the evening.    Clinically is a little better on the medication than off of it.             -Have talked many times with the patient about not  walking at all, unless husband is right there to help her transfer.  Talked about consequences of falls, including hip fractures, brain bleeds, death.  -met with our new social worker over the video visit with me today.  Talked about starting up a virtual support group for the atypical parkinsonian states.  Patient/husband are very interested. 2.  dysphagia             -MBE done on March 10, 2018.  This was essentially normal.  Chin tuck recommended.  Drinking with straws was recommended.  She is still having some issues. 3.  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  Told him to talk to primary care physician  about whether or not she continues to need antihypertensive medication. 4.  Shortness of breath and cough  -Despite modified barium swallow, I suspect that she does have some silent aspiration, and is certainly at risk for that.  I have recommended referral to Dr. Valeta Harms for pulmonary consultation, just so that she gets established with them.  There is likely very little to do on the long-term, but multidisciplinary care is prudent in this disease.  They would like the referral  Follow Up Instructions:  Plan to see the patient back in 4 to 6 months.  We will try to keep this in E- visit, as it is difficult getting her out of the home.  -I discussed the assessment and treatment plan with the patient. The patient was provided an opportunity to ask questions and all were answered. The patient agreed with the plan and demonstrated an understanding of the instructions.   The patient was advised to call back or seek an in-person evaluation if the symptoms worsen or if the condition fails to improve as anticipated.    Total Time spent in visit with the patient was:  25 min, of which more than 50% of the time was spent in counseling and/or coordinating care on safety.   Pt understands and agrees with the plan of care outlined.     Alonza Bogus, DO

## 2018-08-31 ENCOUNTER — Encounter: Payer: Self-pay | Admitting: Neurology

## 2018-08-31 ENCOUNTER — Other Ambulatory Visit: Payer: Self-pay

## 2018-08-31 ENCOUNTER — Telehealth (INDEPENDENT_AMBULATORY_CARE_PROVIDER_SITE_OTHER): Payer: Medicare Other | Admitting: Neurology

## 2018-08-31 DIAGNOSIS — G231 Progressive supranuclear ophthalmoplegia [Steele-Richardson-Olszewski]: Secondary | ICD-10-CM | POA: Diagnosis not present

## 2018-09-01 ENCOUNTER — Other Ambulatory Visit: Payer: Self-pay | Admitting: Neurology

## 2018-09-01 ENCOUNTER — Telehealth: Payer: Self-pay | Admitting: Neurology

## 2018-09-01 DIAGNOSIS — G231 Progressive supranuclear ophthalmoplegia [Steele-Richardson-Olszewski]: Secondary | ICD-10-CM

## 2018-09-01 NOTE — Telephone Encounter (Signed)
Notified pt sending refer. To pulmonology Dr. Tonia Brooms and due to COVID-19-- it might take a while to received a call from the office. Pt understood without questions

## 2018-09-01 NOTE — Telephone Encounter (Signed)
-----   Message from Octaviano Batty Tat, DO sent at 08/31/2018  1:24 PM EDT ----- Please refer to Dr. Tonia Brooms for coughing, silent aspiration related to PSP

## 2018-09-10 ENCOUNTER — Ambulatory Visit: Payer: Medicare Other | Admitting: Neurology

## 2018-11-18 ENCOUNTER — Encounter (HOSPITAL_COMMUNITY): Payer: Medicare Other

## 2019-01-31 NOTE — Progress Notes (Signed)
Virtual Visit via Video Note The purpose of this virtual visit is to provide medical care while limiting exposure to the novel coronavirus.    Consent was obtained for video visit:  Yes.   Answered questions that patient had about telehealth interaction:  Yes.   I discussed the limitations, risks, security and privacy concerns of performing an evaluation and management service by telemedicine. I also discussed with the patient that there may be a patient responsible charge related to this service. The patient expressed understanding and agreed to proceed.  Pt location: Home Physician Location: home Name of referring provider:  Javier Glazier, MD I connected with Shelley Barnes at patients initiation/request on 02/02/2019 at 10:45 AM EDT by video enabled telemedicine application and verified that I am speaking with the correct person using two identifiers. 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  She is on carbidopa/levodopa 25/100, 3 tablets in the morning, 2 in the afternoon and 2 in the evening.  Since our last visit, the patient was admitted/transferred to a skilled nursing unit at [facility].  Her husband was not able to be the sole caregiver any longer.  She was only there for about a week when her husband emailed me back and stated that she was a "high maintenance patient" for them.  He was really struggling as he was not sure he was going to be able to caregiver for her in the home.  I did offer hospice, if they felt that was appropriate, but were not ready at that time.  I have reviewed her nurse practitioner notes, the most recent of which was January 07, 2019.  In that note, it did state that staff was concerned with the patient's increased need for assistance with ADLs and feeding, and increased slurred speech.  Nurse with her states that they are thickening liquids.   Doing well with medications.  Nurse states that she thinks that the patient's weight has been stable.  She is unsure of that, but states that if it had not been, they would have nutrition on the case, and they do not.  Nurse states that they do stand her on a guard rail several times per day.   Observations/Objective:   Vitals:   02/02/19 0814  Weight: 145 lb 12.8 oz (66.1 kg)  Height: 5' (1.524 m)   GEN:  The patient appears stated age and is in NAD.  Neurological examination:  Orientation: The patient is alert.  She does not answer questions of orientation, but most of that appears to be because of psychomotor retardation. 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  Speech lacks spontaneity, but when she speaks, she is hypophonic and pseudobulbar.  She has virtually no extraocular muscle movement, either horizontally or vertically. Motor: Strength is at least antigravity x 4, but the left arm appears dystonic.   Shoulder shrug is equal and symmetric.  There is no pronator drift.  Movement examination: Tone: unable Abnormal movements: None Coordination:  There is very slow with all rapid alternating movements, but she is unable to do any rapid alternating movements in the left upper extremity. Gait and Station: The patient is in a wheelchair and was not ambulated  Labs: They were apparently just done in September, but the most recent I have were from February, 2020.  Assessment and Plan:   1.  PSP             -Continue carbidopa/levodopa 25/100 but discussed how to properly dose.  Should be taking 3 tablets in the morning, 2 in the afternoon and 2 in the evening.    Clinically is a little better on the medication than off of it.             -Patient is now in long-term care.  Her husband was not on the visit today.  I will go ahead and send him a MyChart message at the patient's request.  Hospice has been offered several times.  I actually discussed this with the patient today,  and she declined.  Patient has clinically going much downhill and looks much worse than she did last time I saw her. 2.  dysphagia             -MBE done on March 10, 2018.  This was essentially normal.    Extended-care facility is now thickening all of the liquids.  Follow Up Instructions:  Patient can no longer get to the office.  I will plan on making a follow-up appointment as an E- visit in the next 8 months.   -I discussed the assessment and treatment plan with the patient. The patient was provided an opportunity to ask questions and all were answered. The patient agreed with the plan and demonstrated an understanding of the instructions.   The patient was advised to call back or seek an in-person evaluation if the symptoms worsen or if the condition fails to improve as anticipated.    Total Time spent in visit with the patient was:  15 min, of which more than 50% of the time was spent in counseling and/or coordinating care on safety.   Pt understands and agrees with the plan of care outlined.     Kerin Salen, DO

## 2019-02-01 ENCOUNTER — Other Ambulatory Visit: Payer: Self-pay

## 2019-02-02 ENCOUNTER — Other Ambulatory Visit: Payer: Self-pay

## 2019-02-02 ENCOUNTER — Encounter: Payer: Self-pay | Admitting: Neurology

## 2019-02-02 ENCOUNTER — Telehealth (INDEPENDENT_AMBULATORY_CARE_PROVIDER_SITE_OTHER): Payer: Medicare Other | Admitting: Neurology

## 2019-02-02 VITALS — Ht 60.0 in | Wt 145.8 lb

## 2019-02-02 DIAGNOSIS — G231 Progressive supranuclear ophthalmoplegia [Steele-Richardson-Olszewski]: Secondary | ICD-10-CM | POA: Diagnosis not present

## 2019-02-08 ENCOUNTER — Telehealth: Payer: Medicare Other | Admitting: Neurology

## 2019-10-05 ENCOUNTER — Telehealth: Payer: Medicare Other | Admitting: Neurology

## 2020-09-26 DEATH — deceased
